# Patient Record
Sex: Male | Born: 1955
Health system: Southern US, Community
[De-identification: ages and names within clinical notes are randomized; demographics above are authoritative.]

## PROBLEM LIST (undated history)

## (undated) DIAGNOSIS — Z87898 Personal history of other specified conditions: Secondary | ICD-10-CM

## (undated) DIAGNOSIS — I700 Atherosclerosis of aorta: Secondary | ICD-10-CM

## (undated) DIAGNOSIS — E785 Hyperlipidemia, unspecified: Secondary | ICD-10-CM

## (undated) DIAGNOSIS — I10 Essential (primary) hypertension: Secondary | ICD-10-CM

## (undated) DIAGNOSIS — E559 Vitamin D deficiency, unspecified: Secondary | ICD-10-CM

## (undated) DIAGNOSIS — M1990 Unspecified osteoarthritis, unspecified site: Secondary | ICD-10-CM

## (undated) DIAGNOSIS — T7840XA Allergy, unspecified, initial encounter: Secondary | ICD-10-CM

## (undated) HISTORY — DX: Allergy, unspecified, initial encounter: T78.40XA

## (undated) HISTORY — DX: Unspecified osteoarthritis, unspecified site: M19.90

## (undated) HISTORY — PX: OTHER SURGICAL HISTORY: SHX169

## (undated) HISTORY — DX: Hyperlipidemia, unspecified: E78.5

## (undated) HISTORY — DX: Vitamin D deficiency, unspecified: E55.9

## (undated) HISTORY — DX: Essential (primary) hypertension: I10

## (undated) HISTORY — PX: COLONOSCOPY: SHX174

## (undated) HISTORY — PX: PILONIDAL CYST EXCISION: SHX744

## (undated) HISTORY — PX: KNEE SURGERY: SHX244

## (undated) HISTORY — DX: Atherosclerosis of aorta: I70.0

---
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
## 1974-12-17 HISTORY — PX: APPENDECTOMY: SHX54

## 2005-11-02 ENCOUNTER — Ambulatory Visit: Payer: Self-pay | Admitting: Internal Medicine

## 2005-11-26 ENCOUNTER — Ambulatory Visit: Payer: Self-pay | Admitting: Internal Medicine

## 2013-03-06 ENCOUNTER — Other Ambulatory Visit: Payer: Self-pay | Admitting: Family Medicine

## 2013-03-17 ENCOUNTER — Other Ambulatory Visit (INDEPENDENT_AMBULATORY_CARE_PROVIDER_SITE_OTHER): Payer: PRIVATE HEALTH INSURANCE

## 2013-03-17 DIAGNOSIS — Z09 Encounter for follow-up examination after completed treatment for conditions other than malignant neoplasm: Secondary | ICD-10-CM

## 2013-03-19 NOTE — Progress Notes (Signed)
Pt dropped off FOBT only 

## 2013-04-17 ENCOUNTER — Other Ambulatory Visit: Payer: Self-pay | Admitting: Family Medicine

## 2013-07-02 ENCOUNTER — Ambulatory Visit (INDEPENDENT_AMBULATORY_CARE_PROVIDER_SITE_OTHER): Payer: PRIVATE HEALTH INSURANCE | Admitting: Family Medicine

## 2013-07-02 ENCOUNTER — Encounter: Payer: Self-pay | Admitting: Family Medicine

## 2013-07-02 VITALS — BP 130/77 | HR 63 | Temp 97.0°F | Ht 72.0 in | Wt 209.4 lb

## 2013-07-02 DIAGNOSIS — J309 Allergic rhinitis, unspecified: Secondary | ICD-10-CM

## 2013-07-02 DIAGNOSIS — N529 Male erectile dysfunction, unspecified: Secondary | ICD-10-CM

## 2013-07-02 DIAGNOSIS — R5383 Other fatigue: Secondary | ICD-10-CM

## 2013-07-02 DIAGNOSIS — Z87898 Personal history of other specified conditions: Secondary | ICD-10-CM

## 2013-07-02 DIAGNOSIS — E785 Hyperlipidemia, unspecified: Secondary | ICD-10-CM

## 2013-07-02 DIAGNOSIS — I10 Essential (primary) hypertension: Secondary | ICD-10-CM

## 2013-07-02 DIAGNOSIS — E559 Vitamin D deficiency, unspecified: Secondary | ICD-10-CM

## 2013-07-02 HISTORY — DX: Personal history of other specified conditions: Z87.898

## 2013-07-02 LAB — BASIC METABOLIC PANEL WITH GFR
BUN: 16 mg/dL (ref 6–23)
CO2: 27 mEq/L (ref 19–32)
Chloride: 103 mEq/L (ref 96–112)
Creat: 1.08 mg/dL (ref 0.50–1.35)
Glucose, Bld: 101 mg/dL — ABNORMAL HIGH (ref 70–99)
Potassium: 4.7 mEq/L (ref 3.5–5.3)

## 2013-07-02 LAB — POCT CBC
Granulocyte percent: 71.7 %G (ref 37–80)
HCT, POC: 44.9 % (ref 43.5–53.7)
Lymph, poc: 1.4 (ref 0.6–3.4)
MCH, POC: 33.5 pg — AB (ref 27–31.2)
MCV: 94 fL (ref 80–97)
Platelet Count, POC: 206 10*3/uL (ref 142–424)
RBC: 4.8 M/uL (ref 4.69–6.13)
RDW, POC: 11.9 %
WBC: 6.1 10*3/uL (ref 4.6–10.2)

## 2013-07-02 LAB — HEPATIC FUNCTION PANEL
ALT: 33 U/L (ref 0–53)
AST: 26 U/L (ref 0–37)
Bilirubin, Direct: 0.1 mg/dL (ref 0.0–0.3)
Indirect Bilirubin: 0.5 mg/dL (ref 0.0–0.9)
Total Protein: 6.7 g/dL (ref 6.0–8.3)

## 2013-07-02 NOTE — Progress Notes (Signed)
  Subjective:    Patient ID: Norman Briggs, male    DOB: 1956/09/21, 57 y.o.   MRN: [id number]  HPI Patient returns to clinic today for followup and management of chronic medical problems. These include hypertension, hyperlipidemia, allergic rhinitis, and a history of an elevated PSA. PSA currently is followed by the urologist but we do the blood work here. He also has a history of erectile dysfunction. All of his health maintenance issues appear to be up-to-date. He does complain of a lot of belching, but no heartburn. He brings in his home blood pressure readings today, and these are good.   Review of Systems  Constitutional: Positive for fatigue (slight).  HENT: Positive for congestion (slight due to allergies) and postnasal drip. Negative for ear pain and sore throat.   Eyes: Negative.   Respiratory: Negative.   Cardiovascular: Negative.   Gastrointestinal: Negative.   Genitourinary: Negative.   Musculoskeletal: Positive for arthralgias (bilateral kneea, R shoulder).  Skin: Positive for color change (facial redness).  Allergic/Immunologic: Positive for environmental allergies (seasonal, worse in the Fall).  Neurological: Negative.   Hematological: Negative.   Psychiatric/Behavioral: Negative.        Objective:   Physical Exam BP 130/77  Pulse 63  Temp(Src) 97 F (36.1 C) (Oral)  Ht 6' (1.829 m)  Wt 209 lb 6.4 oz (94.983 kg)  BMI 28.39 kg/m2  The patient appeared well nourished and normally developed, alert and oriented to time and place. Speech, behavior and judgement appear normal. Vital signs as documented.  Head exam is unremarkable. No scleral icterus or pallor noted. There is some nasal congestion on the left. The ear canals, TMs, mouth and throat are all within normal limits.  Neck is without jugular venous distension, thyromegally, or carotid bruits. Carotid upstrokes are brisk bilaterally. No cervical adenopathy. Lungs are clear anteriorly and posteriorly to  auscultation. Normal respiratory effort. Cardiac exam reveals regular rate and rhythm at 60 per minute. First and second heart sounds normal.  No murmurs, rubs or gallops.  Abdominal exam reveals normal bowl sounds, no masses, no organomegaly and no aortic enlargement. No inguinal adenopathy. Abdomen was nontender. Extremities are nonedematous and both femoral and pedal pulses are normal. Skin without pallor or jaundice.  Warm and dry, without rash. There are some small varicosities around the ankles. Neurologic exam reveals normal deep tendon reflexes and normal sensation.          Assessment & Plan:  1. Hyperlipemia  2. Hypertension - Hepatic function panel; Standing - NMR Lipoprofile with Lipids; Standing - BASIC METABOLIC PANEL WITH GFR; Standing - Hepatic function panel - NMR Lipoprofile with Lipids - BASIC METABOLIC PANEL WITH GFR  3. Allergic rhinitis  4. Fatigue - POCT CBC; Standing - Thyroid Panel With TSH - POCT CBC  5. Vitamin D deficiency - Vitamin D 25 hydroxy; Standing - Vitamin D 25 hydroxy  6. History of elevated PSA - POCT urinalysis dipstick - POCT UA - Microscopic Only - PSA  7. ED (erectile dysfunction  Patient Instructions  Continue current medications and aggressive therapeutic lifestyle changes which include diet and exercise For the belching, try Zantac 150 twice daily before breakfast and supper over-the-counter for one month Also try to eat and chew your food more slowly, don't use chewing gum, and avoid dairy products as much as possible This summer drink plenty of fluids and keep well hydrated   Nyra Capes MD

## 2013-07-02 NOTE — Patient Instructions (Signed)
Continue current medications and aggressive therapeutic lifestyle changes which include diet and exercise For the belching, try Zantac 150 twice daily before breakfast and supper over-the-counter for one month Also try to eat and chew your food more slowly, don't use chewing gum, and avoid dairy products as much as possible This summer drink plenty of fluids and keep well hydrated

## 2013-07-03 LAB — NMR LIPOPROFILE WITH LIPIDS
Cholesterol, Total: 116 mg/dL (ref <200)
HDL Particle Number: 33.3 umol/L (ref >=30.5)
LDL Size: 19.8 nm — ABNORMAL LOW (ref >20.5)
Large VLDL-P: 3.6 nmol/L — ABNORMAL HIGH (ref <=2.7)
Small LDL Particle Number: 830 nmol/L — ABNORMAL HIGH (ref <=527)

## 2013-07-08 ENCOUNTER — Other Ambulatory Visit (INDEPENDENT_AMBULATORY_CARE_PROVIDER_SITE_OTHER): Payer: PRIVATE HEALTH INSURANCE

## 2013-07-08 DIAGNOSIS — Z1212 Encounter for screening for malignant neoplasm of rectum: Secondary | ICD-10-CM

## 2013-07-08 NOTE — Progress Notes (Signed)
Pt notified of results

## 2014-01-07 ENCOUNTER — Ambulatory Visit (INDEPENDENT_AMBULATORY_CARE_PROVIDER_SITE_OTHER): Payer: PRIVATE HEALTH INSURANCE

## 2014-01-07 ENCOUNTER — Ambulatory Visit (INDEPENDENT_AMBULATORY_CARE_PROVIDER_SITE_OTHER): Payer: PRIVATE HEALTH INSURANCE | Admitting: Family Medicine

## 2014-01-07 ENCOUNTER — Encounter: Payer: Self-pay | Admitting: Family Medicine

## 2014-01-07 VITALS — BP 126/82 | HR 66 | Temp 96.5°F | Ht 72.0 in | Wt 208.0 lb

## 2014-01-07 DIAGNOSIS — R059 Cough, unspecified: Secondary | ICD-10-CM

## 2014-01-07 DIAGNOSIS — R05 Cough: Secondary | ICD-10-CM

## 2014-01-07 DIAGNOSIS — Z23 Encounter for immunization: Secondary | ICD-10-CM

## 2014-01-07 DIAGNOSIS — I10 Essential (primary) hypertension: Secondary | ICD-10-CM

## 2014-01-07 DIAGNOSIS — E785 Hyperlipidemia, unspecified: Secondary | ICD-10-CM

## 2014-01-07 DIAGNOSIS — E559 Vitamin D deficiency, unspecified: Secondary | ICD-10-CM

## 2014-01-07 DIAGNOSIS — Z87898 Personal history of other specified conditions: Secondary | ICD-10-CM

## 2014-01-07 DIAGNOSIS — E8881 Metabolic syndrome: Secondary | ICD-10-CM

## 2014-01-07 LAB — POCT CBC
Granulocyte percent: 72.8 %G (ref 37–80)
HCT, POC: 48.7 % (ref 43.5–53.7)
HEMOGLOBIN: 16.1 g/dL (ref 14.1–18.1)
LYMPH, POC: 1.3 (ref 0.6–3.4)
MCH: 31.1 pg (ref 27–31.2)
MCHC: 33 g/dL (ref 31.8–35.4)
MCV: 94.2 fL (ref 80–97)
MPV: 8.7 fL (ref 0–99.8)
POC Granulocyte: 5 (ref 2–6.9)
POC LYMPH PERCENT: 19.5 %L (ref 10–50)
Platelet Count, POC: 243 10*3/uL (ref 142–424)
RBC: 5.2 M/uL (ref 4.69–6.13)
RDW, POC: 12.3 %
WBC: 6.9 10*3/uL (ref 4.6–10.2)

## 2014-01-07 LAB — POCT GLYCOSYLATED HEMOGLOBIN (HGB A1C): Hemoglobin A1C: 5.3

## 2014-01-07 MED ORDER — AMLODIPINE BESYLATE-VALSARTAN 5-320 MG PO TABS: ORAL_TABLET | ORAL | Status: DC

## 2014-01-07 MED ORDER — HYDROCHLOROTHIAZIDE 25 MG PO TABS: 25.0000 mg | ORAL_TABLET | Freq: Every day | ORAL | Status: DC

## 2014-01-07 MED ORDER — ATORVASTATIN CALCIUM 20 MG PO TABS: 20.0000 mg | ORAL_TABLET | Freq: Every day | ORAL | Status: DC

## 2014-01-07 MED ORDER — AZELASTINE HCL 0.15 % NA SOLN: NASAL | Status: DC

## 2014-01-07 NOTE — Patient Instructions (Addendum)
Continue current medications. Continue good therapeutic lifestyle changes which include good diet and exercise. Fall precautions discussed with patient. Schedule your flu vaccine if you haven't had it yet If you are over 59 years old - you may need Prevnar 58 or the adult Pneumonia vaccine. Check with her insurance regarding the Prevnar vaccine Use a cool mist humidifier more regularly. Use saline nose spray as directed Drink more fluids You can purchase Mucinex over-the-counter Maximum strength, blue and white in color one twice daily for cough and congestion

## 2014-01-07 NOTE — Progress Notes (Signed)
Subjective:    Patient ID: Norman Briggs, male    DOB: 02-Jan-1956, 58 y.o.   MRN: [id number]  HPI Pt here for follow up and management of chronic medical problems. Patient comes in today. He is doing well. He does describe a cough clearing the throat problem in certain situations. Hi is been oing on for 2-3 months. He indicates that his blood pressure medicine was switched to a generic formulation about the same time. He continues to use his nose sprays. He does use a cool mist humidifier periodically. He has a remote history of cigarette smoking.        Patient Active Problem List   Diagnosis Date Noted  . Hyperlipemia 07/02/2013  . Hypertension 07/02/2013  . Allergic rhinitis 07/02/2013  . History of elevated PSA 07/02/2013  . ED (erectile dysfunction) 07/02/2013  . Vitamin D deficiency 07/02/2013   Outpatient Encounter Prescriptions as of 01/07/2014  Medication Sig  . aspirin EC 81 MG tablet Take 81 mg by mouth daily.  . ASTEPRO 0.15 % SOLN USE 1 SPRAYS IN EACH NOSTRIL AR BEDTIME  . atorvastatin (LIPITOR) 20 MG tablet Take 20 mg by mouth daily at 6 PM. As directed  . Cholecalciferol (VITAMIN D-3) 5000 UNITS TABS Take 1 tablet by mouth daily.  Marland Kitchen EXFORGE 5-320 MG per tablet TAKE 1 TABLET BY MOUTH EVERY DAY  . hydrochlorothiazide (HYDRODIURIL) 25 MG tablet Take 25 mg by mouth daily. As directed  . Multiple Vitamins-Minerals (EYE-VITE PLUS LUTEIN) CAPS Take 1 capsule by mouth daily.   . Omega-3 Fatty Acids (FISH OIL) 1000 MG CAPS Take 2 capsules by mouth daily.  Marland Kitchen triamcinolone (NASACORT) 55 MCG/ACT nasal inhaler USE 1 SPRAY IN EACH NOSTRIL DAILY  . tadalafil (CIALIS) 5 MG tablet Take 5 mg by mouth daily as needed for erectile dysfunction.    Review of Systems  Constitutional: Negative.   HENT: Negative.   Eyes: Negative.   Respiratory: Negative.   Cardiovascular: Negative.   Gastrointestinal: Negative.   Endocrine: Negative.   Genitourinary: Negative.   Musculoskeletal:  Negative.   Skin: Negative.   Allergic/Immunologic: Negative.   Neurological: Negative.   Hematological: Negative.   Psychiatric/Behavioral: Negative.        Objective:   Physical Exam  Nursing note and vitals reviewed. Constitutional: He is oriented to person, place, and time. He appears well-developed and well-nourished. No distress.  HENT:  Head: Normocephalic and atraumatic.  Right Ear: External ear normal.  Left Ear: External ear normal.  Mouth/Throat: Oropharynx is clear and moist. No oropharyngeal exudate.  There is nasal congestion bilaterally  Eyes: Conjunctivae and EOM are normal. Pupils are equal, round, and reactive to light. Right eye exhibits no discharge. Left eye exhibits no discharge. No scleral icterus.  Neck: Normal range of motion. Neck supple. No thyromegaly present.  No carotid bruit  Cardiovascular: Normal rate, regular rhythm, normal heart sounds and intact distal pulses.  Exam reveals no gallop and no friction rub.   No murmur heard. At 60 per minute  Pulmonary/Chest: Effort normal and breath sounds normal. No respiratory distress. He has no wheezes. He has no rales. He exhibits no tenderness.  No axillary nodes  Abdominal: Soft. Bowel sounds are normal. He exhibits no mass. There is no tenderness. There is no rebound and no guarding.  Musculoskeletal: Normal range of motion. He exhibits no edema and no tenderness.  Lymphadenopathy:    He has no cervical adenopathy.  Neurological: He is alert and oriented to person, place,  and time. He has normal reflexes. No cranial nerve deficit.  Skin: Skin is warm and dry. No rash noted. No erythema. No pallor.  Psychiatric: He has a normal mood and affect. His behavior is normal. Judgment and thought content normal.   BP 126/82  Pulse 66  Temp(Src) 96.5 F (35.8 C) (Oral)  Ht 6' (1.829 m)  Wt 208 lb (94.348 kg)  BMI 28.20 kg/m2  [facility] reading (PRIMARY) by  Dr. Brunilda Payor x-ray; --no active disease                                        Assessment & Plan:     1. Vitamin D deficiency - POCT CBC - Vit D  25 hydroxy (rtn osteoporosis monitoring)  2. Hypertension - POCT CBC - BMP8+EGFR - Hepatic function panel - DG Chest 2 View; Future  3. Hyperlipemia - POCT CBC - NMR, lipoprofile  4. History of elevated PSA - POCT CBC  5. Metabolic syndrome - POCT glycosylated hemoglobin (Hb A1C)  6. Cough   Meds ordered this encounter  Medications  . Omega-3 Fatty Acids (FISH OIL) 1000 MG CAPS    Sig: Take 2 capsules by mouth daily.  . hydrochlorothiazide (HYDRODIURIL) 25 MG tablet    Sig: Take 1 tablet (25 mg total) by mouth daily. As directed    Dispense:  30 tablet    Refill:  6  . amLODipine-valsartan (EXFORGE) 5-320 MG per tablet    Sig: TAKE 1 TABLET BY MOUTH EVERY DAY    Dispense:  30 tablet    Refill:  6  . atorvastatin (LIPITOR) 20 MG tablet    Sig: Take 1 tablet (20 mg total) by mouth daily at 6 PM. As directed    Dispense:  30 tablet    Refill:  6  . Azelastine HCl (ASTEPRO) 0.15 % SOLN    Sig: USE 1 SPRAYS IN EACH NOSTRIL AR BEDTIME    Dispense:  16 mL    Refill:  6   Patient Instructions  Continue current medications. Continue good therapeutic lifestyle changes which include good diet and exercise. Fall precautions discussed with patient. Schedule your flu vaccine if you haven't had it yet If you are over 46 years old - you may need Prevnar 73 or the adult Pneumonia vaccine. Check with her insurance regarding the Prevnar vaccine Use a cool mist humidifier more regularly. Use saline nose spray as directed Drink more fluids You can purchase Mucinex over-the-counter Maximum strength, blue and white in color one twice daily for cough and congestion   Arrie Senate MD

## 2014-01-09 LAB — VITAMIN D 25 HYDROXY (VIT D DEFICIENCY, FRACTURES): VIT D 25 HYDROXY: 59.3 ng/mL (ref 30.0–100.0)

## 2014-01-09 LAB — BMP8+EGFR
BUN / CREAT RATIO: 13 (ref 9–20)
BUN: 13 mg/dL (ref 6–24)
CO2: 26 mmol/L (ref 18–29)
CREATININE: 1.04 mg/dL (ref 0.76–1.27)
Calcium: 9.8 mg/dL (ref 8.7–10.2)
Chloride: 98 mmol/L (ref 97–108)
GFR calc Af Amer: 91 mL/min/{1.73_m2} (ref >59)
GFR, EST NON AFRICAN AMERICAN: 79 mL/min/{1.73_m2} (ref >59)
Glucose: 112 mg/dL — ABNORMAL HIGH (ref 65–99)
Potassium: 4.7 mmol/L (ref 3.5–5.2)
SODIUM: 140 mmol/L (ref 134–144)

## 2014-01-09 LAB — HEPATIC FUNCTION PANEL
ALK PHOS: 72 IU/L (ref 39–117)
ALT: 25 IU/L (ref 0–44)
AST: 23 IU/L (ref 0–40)
Albumin: 4.6 g/dL (ref 3.5–5.5)
BILIRUBIN DIRECT: 0.18 mg/dL (ref 0.00–0.40)
BILIRUBIN TOTAL: 0.6 mg/dL (ref 0.0–1.2)
Total Protein: 6.7 g/dL (ref 6.0–8.5)

## 2014-01-09 LAB — NMR, LIPOPROFILE
Cholesterol: 127 mg/dL (ref <200)
HDL Cholesterol by NMR: 47 mg/dL (ref >=40)
HDL Particle Number: 31.7 umol/L (ref >=30.5)
LDL Particle Number: 834 nmol/L (ref <1000)
LDL SIZE: 20.5 nm — AB (ref >20.5)
LDLC SERPL CALC-MCNC: 61 mg/dL (ref <100)
LP-IR Score: 46 — ABNORMAL HIGH (ref <=45)
SMALL LDL PARTICLE NUMBER: 386 nmol/L (ref <=527)
Triglycerides by NMR: 97 mg/dL (ref <150)

## 2014-01-13 ENCOUNTER — Telehealth: Payer: Self-pay | Admitting: Family Medicine

## 2014-01-13 NOTE — Telephone Encounter (Signed)
done

## 2014-01-15 ENCOUNTER — Encounter: Payer: Self-pay | Admitting: *Deleted

## 2014-07-08 ENCOUNTER — Encounter: Payer: Self-pay | Admitting: Family Medicine

## 2014-07-08 ENCOUNTER — Ambulatory Visit (INDEPENDENT_AMBULATORY_CARE_PROVIDER_SITE_OTHER): Payer: PRIVATE HEALTH INSURANCE | Admitting: Family Medicine

## 2014-07-08 VITALS — BP 120/72 | HR 67 | Temp 98.2°F | Ht 72.0 in | Wt 193.0 lb

## 2014-07-08 DIAGNOSIS — E8881 Metabolic syndrome: Secondary | ICD-10-CM

## 2014-07-08 DIAGNOSIS — N528 Other male erectile dysfunction: Secondary | ICD-10-CM

## 2014-07-08 DIAGNOSIS — Z87898 Personal history of other specified conditions: Secondary | ICD-10-CM

## 2014-07-08 DIAGNOSIS — E785 Hyperlipidemia, unspecified: Secondary | ICD-10-CM

## 2014-07-08 DIAGNOSIS — E559 Vitamin D deficiency, unspecified: Secondary | ICD-10-CM

## 2014-07-08 DIAGNOSIS — L989 Disorder of the skin and subcutaneous tissue, unspecified: Secondary | ICD-10-CM

## 2014-07-08 DIAGNOSIS — I10 Essential (primary) hypertension: Secondary | ICD-10-CM

## 2014-07-08 DIAGNOSIS — Z0000 Encounter for general adult medical examination without abnormal findings: Secondary | ICD-10-CM | POA: Diagnosis not present

## 2014-07-08 LAB — POCT CBC
GRANULOCYTE PERCENT: 77 % (ref 37–80)
HCT, POC: 49.3 % (ref 43.5–53.7)
HEMOGLOBIN: 15.9 g/dL (ref 14.1–18.1)
Lymph, poc: 1.2 (ref 0.6–3.4)
MCH, POC: 30.2 pg (ref 27–31.2)
MCHC: 32.3 g/dL (ref 31.8–35.4)
MCV: 93.5 fL (ref 80–97)
MPV: 8.3 fL (ref 0–99.8)
POC GRANULOCYTE: 5.2 (ref 2–6.9)
POC LYMPH %: 17.8 % (ref 10–50)
Platelet Count, POC: 239 10*3/uL (ref 142–424)
RBC: 5.3 M/uL (ref 4.69–6.13)
RDW, POC: 12.6 %
WBC: 6.7 10*3/uL (ref 4.6–10.2)

## 2014-07-08 LAB — POCT UA - MICROSCOPIC ONLY
Bacteria, U Microscopic: NEGATIVE
CRYSTALS, UR, HPF, POC: NEGATIVE
Casts, Ur, LPF, POC: NEGATIVE
Mucus, UA: NEGATIVE
RBC, urine, microscopic: NEGATIVE
WBC, Ur, HPF, POC: NEGATIVE
Yeast, UA: NEGATIVE

## 2014-07-08 LAB — POCT URINALYSIS DIPSTICK
Bilirubin, UA: NEGATIVE
Blood, UA: NEGATIVE
Glucose, UA: NEGATIVE
Ketones, UA: NEGATIVE
Leukocytes, UA: NEGATIVE
Nitrite, UA: NEGATIVE
PH UA: 7.5
PROTEIN UA: NEGATIVE
SPEC GRAV UA: 1.01
Urobilinogen, UA: NEGATIVE

## 2014-07-08 LAB — POCT GLYCOSYLATED HEMOGLOBIN (HGB A1C)

## 2014-07-08 NOTE — Progress Notes (Signed)
Subjective:    Patient ID: Norman Briggs, male    DOB: 12/27/1955, 58 y.o.   MRN: [id number]  HPI Patient is here today for annual wellness exam and follow up of chronic medical problems. He does complain of some right knee pain and he is concerned about some skin lesions on his nose and ear. He also sees the urologist and [facility] once a year. He has an appointment scheduled for today. He will get lab work today and an FOBT to return. He is also due to receive a Prevnar he will have to check with his insurance regarding this. The patient is also concerned that his erections are not as good as they used to be. He has tried Cialis 5 and this is not working well for him. He is seeing the urologist next week.        Patient Active Problem List   Diagnosis Date Noted  . Hyperlipemia 07/02/2013  . Hypertension 07/02/2013  . Allergic rhinitis 07/02/2013  . History of elevated PSA 07/02/2013  . ED (erectile dysfunction) 07/02/2013  . Vitamin D deficiency 07/02/2013   Outpatient Encounter Prescriptions as of 07/08/2014  Medication Sig  . amLODipine-valsartan (EXFORGE) 5-320 MG per tablet TAKE 1 TABLET BY MOUTH EVERY DAY  . aspirin EC 81 MG tablet Take 81 mg by mouth daily.  . Azelastine HCl (ASTEPRO) 0.15 % SOLN USE 1 SPRAYS IN EACH NOSTRIL AR BEDTIME  . Cholecalciferol (VITAMIN D-3) 5000 UNITS TABS Take 1 tablet by mouth daily.  . hydrochlorothiazide (HYDRODIURIL) 25 MG tablet Take 1 tablet (25 mg total) by mouth daily. As directed  . Multiple Vitamins-Minerals (EYE-VITE PLUS LUTEIN) CAPS Take 1 capsule by mouth daily.   . Omega-3 Fatty Acids (FISH OIL) 1000 MG CAPS Take 2 capsules by mouth daily.  Marland Kitchen triamcinolone (NASACORT) 55 MCG/ACT nasal inhaler USE 1 SPRAY IN EACH NOSTRIL DAILY  . tadalafil (CIALIS) 5 MG tablet Take 5 mg by mouth daily as needed for erectile dysfunction.  . [DISCONTINUED] atorvastatin (LIPITOR) 20 MG tablet Take 1 tablet (20 mg total) by mouth daily at 6 PM. As  directed    Review of Systems  Constitutional: Negative.   HENT: Negative.   Eyes: Negative.   Respiratory: Negative.   Cardiovascular: Negative.   Gastrointestinal: Negative.   Endocrine: Negative.   Genitourinary: Negative.   Musculoskeletal: Positive for arthralgias (right knee).  Skin: Negative.        Check skin lesions - nose and ear  Allergic/Immunologic: Negative.   Neurological: Negative.   Hematological: Negative.   Psychiatric/Behavioral: Negative.        Objective:   Physical Exam  Nursing note and vitals reviewed. Constitutional: He is oriented to person, place, and time. He appears well-developed and well-nourished. No distress.  HENT:  Head: Normocephalic and atraumatic.  Right Ear: External ear normal.  Left Ear: External ear normal.  Mouth/Throat: Oropharynx is clear and moist. No oropharyngeal exudate.  Nasal congestion bilaterally  Eyes: Conjunctivae and EOM are normal. Pupils are equal, round, and reactive to light. Right eye exhibits no discharge. Left eye exhibits no discharge. No scleral icterus.  Neck: Normal range of motion. Neck supple. No thyromegaly present.  Cardiovascular: Normal rate, regular rhythm, normal heart sounds and intact distal pulses.  Exam reveals no gallop and no friction rub.   No murmur heard. At 72 per minute  Pulmonary/Chest: Effort normal and breath sounds normal. No respiratory distress. He has no wheezes. He has no rales. He exhibits  no tenderness.  Abdominal: Soft. Bowel sounds are normal. He exhibits no mass. There is no tenderness. There is no rebound and no guarding.  Genitourinary:  This exam will be done by the urologist next week  Musculoskeletal: Normal range of motion. He exhibits no edema and no tenderness.  Lymphadenopathy:    He has no cervical adenopathy.  Neurological: He is alert and oriented to person, place, and time. He has normal reflexes. No cranial nerve deficit.  Skin: Skin is warm and dry. No rash  noted. There is erythema. No pallor.  Dry skin lesion right ear pinna. Nasal erythema and small eschar nasal tip  Psychiatric: He has a normal mood and affect. His behavior is normal. Judgment and thought content normal.   BP 120/72  Pulse 67  Temp(Src) 98.2 F (36.8 C) (Oral)  Ht 6' (1.829 m)  Wt 193 lb (87.544 kg)  BMI 26.17 kg/m2  DGL:[id number] from previous tracings       Assessment & Plan:  1. Hyperlipemia - POCT CBC - NMR, lipoprofile - EKG 12-Lead  2. Essential hypertension - POCT CBC - BMP8+EGFR - Hepatic function panel - EKG 12-Lead  3. Vitamin D deficiency - POCT CBC - Vit D  25 hydroxy (rtn osteoporosis monitoring)  4. History of elevated PSA - POCT CBC - POCT UA - Microscopic Only - POCT urinalysis dipstick - PSA, total and free  5. Metabolic syndrome - POCT CBC - POCT glycosylated hemoglobin (Hb A1C)  6. Annual physical exam - POCT CBC - POCT glycosylated hemoglobin (Hb A1C) - BMP8+EGFR - Hepatic function panel - NMR, lipoprofile - Vit D  25 hydroxy (rtn osteoporosis monitoring) - POCT UA - Microscopic Only - POCT urinalysis dipstick - PSA, total and free - EKG 12-Lead  7. Skin lesions - Ambulatory referral to Dermatology  8. Other male erectile dysfunction -A sample of Cialis 20 was given to the patient to try  No orders of the defined types were placed in this encounter.    Patient Instructions  Continue current medications. Continue good therapeutic lifestyle changes which include good diet and exercise. Fall precautions discussed with patient. If an FOBT was given today- please return it to our front desk. If you are over 46 years old - you may need Prevnar 80 or the adult Pneumonia vaccine.  Please check with your insurance regarding the Prevnar vaccine Please arrange an appointment with the orthopedist that has been following you about your knee pain We will arrange an appointment with your dermatologist for the nasal  irritation and the right ear skin lesion Stay active, followup aggressive therapeutic lifestyle changes which include diet and exercise We have given you samples of Cialis 20 for you to try. Let the urologist know if this is effective or not   Return the FOBT Arrie Senate MD

## 2014-07-08 NOTE — Patient Instructions (Addendum)
Continue current medications. Continue good therapeutic lifestyle changes which include good diet and exercise. Fall precautions discussed with patient. If an FOBT was given today- please return it to our front desk. If you are over 59 years old - you may need Prevnar 83 or the adult Pneumonia vaccine.  Please check with your insurance regarding the Prevnar vaccine Please arrange an appointment with the orthopedist that has been following you about your knee pain We will arrange an appointment with your dermatologist for the nasal irritation and the right ear skin lesion Stay active, followup aggressive therapeutic lifestyle changes which include diet and exercise We have given you samples of Cialis 20 for you to try. Let the urologist know if this is effective or not Return the FOBT

## 2014-07-09 ENCOUNTER — Telehealth: Payer: Self-pay | Admitting: Family Medicine

## 2014-07-09 LAB — BMP8+EGFR
BUN/Creatinine Ratio: 14 (ref 9–20)
BUN: 17 mg/dL (ref 6–24)
CALCIUM: 9.6 mg/dL (ref 8.7–10.2)
CO2: 24 mmol/L (ref 18–29)
Chloride: 99 mmol/L (ref 97–108)
Creatinine, Ser: 1.2 mg/dL (ref 0.76–1.27)
GFR calc Af Amer: 77 mL/min/{1.73_m2} (ref >59)
GFR calc non Af Amer: 66 mL/min/{1.73_m2} (ref >59)
GLUCOSE: 103 mg/dL — AB (ref 65–99)
POTASSIUM: 4.3 mmol/L (ref 3.5–5.2)
Sodium: 140 mmol/L (ref 134–144)

## 2014-07-09 LAB — NMR, LIPOPROFILE
CHOLESTEROL: 166 mg/dL (ref 100–199)
HDL Cholesterol by NMR: 54 mg/dL (ref >39)
HDL Particle Number: 31.5 umol/L (ref >=30.5)
LDL PARTICLE NUMBER: 1319 nmol/L — AB (ref <1000)
LDL SIZE: 20.9 nm (ref >20.5)
LDLC SERPL CALC-MCNC: 98 mg/dL (ref 0–99)
LP-IR Score: 32 (ref <=45)
Small LDL Particle Number: 420 nmol/L (ref <=527)
TRIGLYCERIDES BY NMR: 68 mg/dL (ref 0–149)

## 2014-07-09 LAB — PSA, TOTAL AND FREE
PSA, Free Pct: 24.4 %
PSA, Free: 0.39 ng/mL
PSA: 1.6 ng/mL (ref 0.0–4.0)

## 2014-07-09 LAB — HEPATIC FUNCTION PANEL
ALT: 19 IU/L (ref 0–44)
AST: 24 IU/L (ref 0–40)
Albumin: 4.5 g/dL (ref 3.5–5.5)
Alkaline Phosphatase: 70 IU/L (ref 39–117)
BILIRUBIN DIRECT: 0.18 mg/dL (ref 0.00–0.40)
TOTAL PROTEIN: 6.9 g/dL (ref 6.0–8.5)
Total Bilirubin: 0.8 mg/dL (ref 0.0–1.2)

## 2014-07-09 LAB — VITAMIN D 25 HYDROXY (VIT D DEFICIENCY, FRACTURES): Vit D, 25-Hydroxy: 65.2 ng/mL (ref 30.0–100.0)

## 2014-07-09 NOTE — Telephone Encounter (Signed)
Message copied by Waverly Ferrari on Fri Jul 09, 2014 10:12 AM ------      Message from: Chipper Herb      Created: Fri Jul 09, 2014  7:15 AM       Blood sugar slightly elevated at 103. The creatinine, the most important kidney function test is within normal limits. The electrolytes including potassium are within normal limits.      All liver function tests are within normal limits.      Advanced lipid testing, a total LDL particle number is elevated at 1319. Previously it was 834. The goal is to have this number less than 1000. The LDL C. remains at goal at 98 but more than it was 6 months ago when it was 61. The triglycerides are good at 68.----For now, no statin treatment just continue with as aggressive therapeutic lifestyle changes as possible which include diet exercise and weight loss. We will readdress the need for any further statin treatment at the next blood draw.      The vitamin D level is good at 65.2, continue current treatment      The PSA remains low and within normal limits. It is now 1.6. ------

## 2014-07-19 ENCOUNTER — Other Ambulatory Visit: Payer: PRIVATE HEALTH INSURANCE

## 2014-07-19 DIAGNOSIS — Z1212 Encounter for screening for malignant neoplasm of rectum: Secondary | ICD-10-CM

## 2014-07-20 LAB — FECAL OCCULT BLOOD, IMMUNOCHEMICAL: Fecal Occult Bld: NEGATIVE

## 2014-07-26 ENCOUNTER — Encounter: Payer: Self-pay | Admitting: *Deleted

## 2015-01-11 ENCOUNTER — Encounter: Payer: Self-pay | Admitting: Family Medicine

## 2015-01-11 ENCOUNTER — Ambulatory Visit (INDEPENDENT_AMBULATORY_CARE_PROVIDER_SITE_OTHER): Payer: PRIVATE HEALTH INSURANCE

## 2015-01-11 ENCOUNTER — Ambulatory Visit (INDEPENDENT_AMBULATORY_CARE_PROVIDER_SITE_OTHER): Payer: PRIVATE HEALTH INSURANCE | Admitting: Family Medicine

## 2015-01-11 VITALS — BP 128/90 | HR 70 | Temp 97.1°F | Ht 72.0 in | Wt 202.0 lb

## 2015-01-11 DIAGNOSIS — R635 Abnormal weight gain: Secondary | ICD-10-CM

## 2015-01-11 DIAGNOSIS — M25511 Pain in right shoulder: Secondary | ICD-10-CM

## 2015-01-11 DIAGNOSIS — I10 Essential (primary) hypertension: Secondary | ICD-10-CM | POA: Diagnosis not present

## 2015-01-11 DIAGNOSIS — E785 Hyperlipidemia, unspecified: Secondary | ICD-10-CM | POA: Diagnosis not present

## 2015-01-11 DIAGNOSIS — E559 Vitamin D deficiency, unspecified: Secondary | ICD-10-CM | POA: Diagnosis not present

## 2015-01-11 DIAGNOSIS — N528 Other male erectile dysfunction: Secondary | ICD-10-CM | POA: Diagnosis not present

## 2015-01-11 DIAGNOSIS — Z87898 Personal history of other specified conditions: Secondary | ICD-10-CM

## 2015-01-11 LAB — POCT CBC
Granulocyte percent: 79.1 %G (ref 37–80)
HCT, POC: 50.5 % (ref 43.5–53.7)
Hemoglobin: 16.2 g/dL (ref 14.1–18.1)
Lymph, poc: 1.4 (ref 0.6–3.4)
MCH: 30.2 pg (ref 27–31.2)
MCHC: 32 g/dL (ref 31.8–35.4)
MCV: 94.3 fL (ref 80–97)
MPV: 7.9 fL (ref 0–99.8)
POC Granulocyte: 6.4 (ref 2–6.9)
POC LYMPH %: 16.7 % (ref 10–50)
Platelet Count, POC: 252 10*3/uL (ref 142–424)
RBC: 5.4 M/uL (ref 4.69–6.13)
RDW, POC: 11.8 %
WBC: 8.1 10*3/uL (ref 4.6–10.2)

## 2015-01-11 MED ORDER — MELOXICAM 15 MG PO TABS: 15.0000 mg | ORAL_TABLET | Freq: Every day | ORAL | Status: DC

## 2015-01-11 MED ORDER — SILDENAFIL CITRATE 20 MG PO TABS: ORAL_TABLET | ORAL | Status: DC

## 2015-01-11 NOTE — Progress Notes (Signed)
Subjective:    Patient ID: Norman Briggs, male    DOB: [birth year], 59 y.o.   MRN: [id number]  HPI Pt here for follow up and management of chronic medical problems which include hypertension and hyperlipidemia. He is taking all medications regularly. The patient does complain of ongoing problems with his right knee. He has seen an orthopedist about this. He also complains of right shoulder pain. The patient cannot describe any specific injury to the shoulder. It is important to note that the patient has gained 9 pounds of weight since his last visit in July. The patient does continue to have trouble with his knee and he is been told by the orthopedic surgeon that he needs a knee replacement. He is actually and a lot of pain with his right shoulder and this pain he thinks is coming from old injuries from throwing the baseball etc. his no trouble swinging the golf club. He does continue to work at a [employer], [employer] and has to lift tires every day.        Patient Active Problem List   Diagnosis Date Noted  . Metabolic syndrome 76/81/1572  . Hyperlipemia 07/02/2013  . Hypertension 07/02/2013  . Allergic rhinitis 07/02/2013  . History of elevated PSA 07/02/2013  . ED (erectile dysfunction) 07/02/2013  . Vitamin D deficiency 07/02/2013   Outpatient Encounter Prescriptions as of 01/11/2015  Medication Sig  . amLODipine-valsartan (EXFORGE) 5-320 MG per tablet TAKE 1 TABLET BY MOUTH EVERY DAY  . aspirin EC 81 MG tablet Take 81 mg by mouth daily.  . Cholecalciferol (VITAMIN D-3) 5000 UNITS TABS Take 1 tablet by mouth daily.  . hydrochlorothiazide (HYDRODIURIL) 25 MG tablet Take 1 tablet (25 mg total) by mouth daily. As directed  . Multiple Vitamins-Minerals (EYE-VITE PLUS LUTEIN) CAPS Take 1 capsule by mouth daily.   . Omega-3 Fatty Acids (FISH OIL) 1000 MG CAPS Take 2 capsules by mouth daily.  . [DISCONTINUED] tadalafil (CIALIS) 5 MG tablet Take 5 mg by mouth daily as needed for erectile  dysfunction.  . Azelastine HCl (ASTEPRO) 0.15 % SOLN USE 1 SPRAYS IN EACH NOSTRIL AR BEDTIME (Patient not taking: Reported on 01/11/2015)  . triamcinolone (NASACORT) 55 MCG/ACT nasal inhaler USE 1 SPRAY IN EACH NOSTRIL DAILY (Patient not taking: Reported on 01/11/2015)    Review of Systems  Constitutional: Negative.   HENT: Negative.   Eyes: Negative.   Respiratory: Negative.   Cardiovascular: Negative.   Gastrointestinal: Negative.   Endocrine: Negative.   Genitourinary: Negative.   Musculoskeletal: Positive for arthralgias (right knee - seen ortho         also       right shoulder pain ).  Skin: Negative.   Allergic/Immunologic: Negative.   Neurological: Negative.   Hematological: Negative.   Psychiatric/Behavioral: Negative.        Objective:   Physical Exam  Constitutional: He is oriented to person, place, and time. He appears well-developed and well-nourished. No distress.  HENT:  Head: Normocephalic and atraumatic.  Right Ear: External ear normal.  Left Ear: External ear normal.  Nose: Nose normal.  Mouth/Throat: Oropharynx is clear and moist. No oropharyngeal exudate.  Eyes: Conjunctivae and EOM are normal. Pupils are equal, round, and reactive to light. Right eye exhibits no discharge. Left eye exhibits no discharge. No scleral icterus.  Neck: Normal range of motion. Neck supple. No thyromegaly present.  No carotid bruits or anterior cervical adenopathy  Cardiovascular: Normal rate, regular rhythm, normal heart sounds and intact distal  pulses.   No murmur heard. The heart has a regular rate and rhythm at 72/m. The patient has good pulses.  Pulmonary/Chest: Effort normal and breath sounds normal. No respiratory distress. He has no wheezes. He has no rales. He exhibits no tenderness.  Axillary is negative for adenopathy and lungs are clear anteriorly and posteriorly  Abdominal: Soft. Bowel sounds are normal. He exhibits no mass. There is no tenderness. There is no rebound  and no guarding.  The abdomen is tender without masses bruits or inguinal adenopathy.  Genitourinary: Rectum normal.  Musculoskeletal: Normal range of motion. He exhibits no edema or tenderness.  There is generalized tenderness around the anterior shoulder there is no before meals joint tenderness there is no suprascapular tenderness.  Lymphadenopathy:    He has no cervical adenopathy.  Neurological: He is alert and oriented to person, place, and time. He has normal reflexes. No cranial nerve deficit.  Skin: Skin is warm and dry. No rash noted. No erythema. No pallor.  Psychiatric: He has a normal mood and affect. His behavior is normal. Judgment and thought content normal.  Nursing note and vitals reviewed.  BP 128/90 mmHg  Pulse 70  Temp(Src) 97.1 F (36.2 C) (Oral)  Ht 6' (1.829 m)  Wt 202 lb (91.627 kg)  BMI 27.39 kg/m2  [facility] reading (PRIMARY) by  Dr. Governor Rooks shoulder and C-spine-- degenerative changes                                       Assessment & Plan:  1. Hyperlipemia -Continue with aggressive therapeutic lifestyle changes and omega-3 fatty acids pending results of lab work being done today. - POCT CBC - NMR, lipoprofile  2. Essential hypertension -Continue sodium restriction, weight loss and monitor blood pressures closely -Continue with current medication - POCT CBC - BMP8+EGFR - Hepatic function panel  3. Vitamin D deficiency -Continue vitamin D 5000 units daily - POCT CBC - Vit D  25 hydroxy (rtn osteoporosis monitoring)  4. History of elevated PSA -Continue to be followed up by urologist - POCT CBC  5. Right shoulder pain -Take meloxicam one daily after eating as directed - DG Shoulder Right; Future - DG Cervical Spine Complete; Future  6. Other male erectile dysfunction -Try generic Viagra through [facility]  7. Weight gain -More aggressive management with exercise and diet  Meds ordered this encounter  Medications  . meloxicam  (MOBIC) 15 MG tablet    Sig: Take 1 tablet (15 mg total) by mouth daily. After a meal    Dispense:  30 tablet    Refill:  1  . sildenafil (REVATIO) 20 MG tablet    Sig: Take 2-5 tabs as needed for sexual activity.    Dispense:  50 tablet    Refill:  0   Arrie Senate MD

## 2015-01-11 NOTE — Patient Instructions (Addendum)
Continue current medications. Continue good therapeutic lifestyle changes which include good diet and exercise. Fall precautions discussed with patient. If an FOBT was given today- please return it to our front desk. If you are over 60 years old - you may need Prevnar 63 or the adult Pneumonia vaccine.  Flu Shots will be available at our office starting mid- September. Please call and schedule a FLU CLINIC APPOINTMENT.   Take anti-inflammatory medicine as directed--- please be aware of any potential allergic reaction with this medication. Make sure you take it after eating and continue to monitor your blood pressure regularly. Be sure and check with your insurance regarding the Prevnar vaccine and the shingles shot According to your history, you may be due to get a colonoscopy this fall. We will confirm this with the gastroenterologist to make sure that you are on scheduled to get this. Use warm wet compresses to the shoulder Please consider the diet program you are discussing and continue to watch sodium intake closely We will give you a prescription for you to send to [facility] for Viagra generic 20 mg pills with refills

## 2015-01-12 LAB — BMP8+EGFR
BUN/Creatinine Ratio: 15 (ref 9–20)
BUN: 16 mg/dL (ref 6–24)
CO2: 25 mmol/L (ref 18–29)
CREATININE: 1.06 mg/dL (ref 0.76–1.27)
Calcium: 9.4 mg/dL (ref 8.7–10.2)
Chloride: 99 mmol/L (ref 97–108)
GFR calc Af Amer: 88 mL/min/{1.73_m2} (ref >59)
GFR calc non Af Amer: 76 mL/min/{1.73_m2} (ref >59)
Glucose: 113 mg/dL — ABNORMAL HIGH (ref 65–99)
POTASSIUM: 4.4 mmol/L (ref 3.5–5.2)
Sodium: 139 mmol/L (ref 134–144)

## 2015-01-12 LAB — NMR, LIPOPROFILE
CHOLESTEROL: 194 mg/dL (ref 100–199)
HDL Cholesterol by NMR: 54 mg/dL (ref >39)
HDL Particle Number: 33.1 umol/L (ref >=30.5)
LDL PARTICLE NUMBER: 1614 nmol/L — AB (ref <1000)
LDL Size: 20.8 nm (ref >20.5)
LDL-C: 125 mg/dL — ABNORMAL HIGH (ref 0–99)
LP-IR Score: 42 (ref <=45)
SMALL LDL PARTICLE NUMBER: 670 nmol/L — AB (ref <=527)
Triglycerides by NMR: 74 mg/dL (ref 0–149)

## 2015-01-12 LAB — HEPATIC FUNCTION PANEL
ALT: 18 IU/L (ref 0–44)
AST: 21 IU/L (ref 0–40)
Albumin: 4.3 g/dL (ref 3.5–5.5)
Alkaline Phosphatase: 71 IU/L (ref 39–117)
BILIRUBIN TOTAL: 0.4 mg/dL (ref 0.0–1.2)
Bilirubin, Direct: 0.1 mg/dL (ref 0.00–0.40)
TOTAL PROTEIN: 6.9 g/dL (ref 6.0–8.5)

## 2015-01-12 LAB — VITAMIN D 25 HYDROXY (VIT D DEFICIENCY, FRACTURES): Vit D, 25-Hydroxy: 61.1 ng/mL (ref 30.0–100.0)

## 2015-01-17 ENCOUNTER — Other Ambulatory Visit: Payer: Self-pay | Admitting: *Deleted

## 2015-01-17 ENCOUNTER — Telehealth: Payer: Self-pay | Admitting: *Deleted

## 2015-01-17 MED ORDER — ROSUVASTATIN CALCIUM 10 MG PO TABS
10.0000 mg | ORAL_TABLET | Freq: Every day | ORAL | Status: DC
Start: 2015-01-17 — End: 2015-07-19

## 2015-01-17 NOTE — Telephone Encounter (Signed)
-----   Message from Chipper Herb, MD sent at 01/12/2015 10:21 AM EST ----- The blood sugars elevated at 113. This number should be less than 100. The creatinine, the most important kidney function test is within normal limits. The electrolytes including potassium are within normal limits. All liver function tests are within normal limits The total LDL particle number is higher than it has been over the past year. The number is [id number]. The LDL C is elevated at 125. The triglycerides are good.------- please reemphasized aggressive therapeutic lifestyle changes which include diet and exercise and weight loss. The patient has been intolerant to atorvastatin in the past. Please try Crestor 10 mg with a savings coupon so that he can get a 3 month supply for $3. He would take the Crestor at bedtime. A prescription for 90. He would need to have liver function tests checked 4 weeks after starting the Crestor. If he develops aches pains or myalgias he should discontinue the medication. The vitamin D level is good, he should continue his current treatment for vitamin D which includes 5000 units daily

## 2015-01-17 NOTE — Telephone Encounter (Signed)
A ware of results and will begin crestor.

## 2015-01-17 NOTE — Telephone Encounter (Signed)
Please call to discuss lab results and medications.

## 2015-02-05 ENCOUNTER — Other Ambulatory Visit: Payer: Self-pay | Admitting: Family Medicine

## 2015-03-29 ENCOUNTER — Other Ambulatory Visit: Payer: Self-pay | Admitting: *Deleted

## 2015-03-29 MED ORDER — AMLODIPINE BESYLATE-VALSARTAN 5-320 MG PO TABS: ORAL_TABLET | ORAL | Status: DC

## 2015-04-01 ENCOUNTER — Encounter: Payer: Self-pay | Admitting: *Deleted

## 2015-04-04 ENCOUNTER — Other Ambulatory Visit: Payer: Self-pay | Admitting: Family Medicine

## 2015-04-18 ENCOUNTER — Telehealth: Payer: Self-pay | Admitting: Nurse Practitioner

## 2015-04-18 ENCOUNTER — Telehealth: Payer: Self-pay | Admitting: Family Medicine

## 2015-04-18 MED ORDER — AZELASTINE HCL 0.15 % NA SOLN: NASAL | Status: DC

## 2015-04-18 NOTE — Telephone Encounter (Signed)
Please refill this for one year  

## 2015-04-18 NOTE — Telephone Encounter (Signed)
Patient is requesting refill on azelastine.

## 2015-04-18 NOTE — Telephone Encounter (Signed)
Patient aware rx called in  

## 2015-04-18 NOTE — Telephone Encounter (Signed)
error 

## 2015-05-10 ENCOUNTER — Other Ambulatory Visit: Payer: Self-pay | Admitting: Family Medicine

## 2015-05-10 NOTE — Telephone Encounter (Signed)
Last seen 01/11/15 [facility]

## 2015-05-10 NOTE — Telephone Encounter (Signed)
This is okay to refill with refills for one year

## 2015-05-18 ENCOUNTER — Telehealth: Payer: Self-pay | Admitting: Family Medicine

## 2015-05-18 DIAGNOSIS — M25561 Pain in right knee: Secondary | ICD-10-CM

## 2015-05-20 NOTE — Telephone Encounter (Signed)
Referral placed.

## 2015-07-18 ENCOUNTER — Ambulatory Visit: Payer: PRIVATE HEALTH INSURANCE | Admitting: Family Medicine

## 2015-07-19 ENCOUNTER — Encounter: Payer: Self-pay | Admitting: Family Medicine

## 2015-07-19 ENCOUNTER — Ambulatory Visit (INDEPENDENT_AMBULATORY_CARE_PROVIDER_SITE_OTHER): Payer: PRIVATE HEALTH INSURANCE | Admitting: Family Medicine

## 2015-07-19 VITALS — BP 139/90 | HR 64 | Temp 97.4°F | Ht 72.0 in | Wt 203.2 lb

## 2015-07-19 DIAGNOSIS — Z8249 Family history of ischemic heart disease and other diseases of the circulatory system: Secondary | ICD-10-CM | POA: Diagnosis not present

## 2015-07-19 DIAGNOSIS — M542 Cervicalgia: Secondary | ICD-10-CM

## 2015-07-19 DIAGNOSIS — M25511 Pain in right shoulder: Secondary | ICD-10-CM | POA: Diagnosis not present

## 2015-07-19 DIAGNOSIS — I10 Essential (primary) hypertension: Secondary | ICD-10-CM

## 2015-07-19 DIAGNOSIS — M1711 Unilateral primary osteoarthritis, right knee: Secondary | ICD-10-CM | POA: Diagnosis not present

## 2015-07-19 DIAGNOSIS — Z0000 Encounter for general adult medical examination without abnormal findings: Secondary | ICD-10-CM | POA: Diagnosis not present

## 2015-07-19 DIAGNOSIS — E8881 Metabolic syndrome: Secondary | ICD-10-CM

## 2015-07-19 DIAGNOSIS — E559 Vitamin D deficiency, unspecified: Secondary | ICD-10-CM | POA: Diagnosis not present

## 2015-07-19 LAB — POCT CBC
Granulocyte percent: 77.5 %G (ref 37–80)
HCT, POC: 49.2 % (ref 43.5–53.7)
Hemoglobin: 15.5 g/dL (ref 14.1–18.1)
Lymph, poc: 1.5 (ref 0.6–3.4)
MCH, POC: 29.3 pg (ref 27–31.2)
MCHC: 31.6 g/dL — AB (ref 31.8–35.4)
MCV: 92.7 fL (ref 80–97)
MPV: 8.2 fL (ref 0–99.8)
POC Granulocyte: 6 (ref 2–6.9)
POC LYMPH %: 18.7 % (ref 10–50)
Platelet Count, POC: 240 10*3/uL (ref 142–424)
RBC: 5.31 M/uL (ref 4.69–6.13)
RDW, POC: 12 %
WBC: 7.8 10*3/uL (ref 4.6–10.2)

## 2015-07-19 MED ORDER — ROSUVASTATIN CALCIUM 10 MG PO TABS: 10.0000 mg | ORAL_TABLET | Freq: Every day | ORAL | Status: DC

## 2015-07-19 MED ORDER — HYDROCHLOROTHIAZIDE 25 MG PO TABS: ORAL_TABLET | ORAL | Status: DC

## 2015-07-19 MED ORDER — AMLODIPINE BESYLATE-VALSARTAN 5-320 MG PO TABS: ORAL_TABLET | ORAL | Status: DC

## 2015-07-19 NOTE — Patient Instructions (Addendum)
Continue current medications. Continue good therapeutic lifestyle changes which include good diet and exercise. Fall precautions discussed with patient. If an FOBT was given today- please return it to our front desk. If you are over 60 years old - you may need Prevnar 70 or the adult Pneumonia vaccine.   After your visit with [place] today you will receive a survey in the mail or online from [employer] regarding your care with [place]. Please take a moment to fill this out. Your feedback is very important to [place] as you can help [place] better understand your patient needs as well as improve your experience and satisfaction. WE CARE ABOUT YOU!!!   Because of the patient's continued complaint with neck pain and right shoulder pain and the abnormality of previous films of the cervical spine and shoulder we will arrange for him to have an MRI of the C-spine and right shoulder. He is also scheduled in early October to have his knee replaced on the right side. We will get a cardiac clearance because his father who was not a smoker had a heart attack in his 43s. The patient currently has no chest pain or chest tightness. He should continue with follow-up as planned by the urologist Dr. Anthoney Harada. We will send a copy of PSA result to him.

## 2015-07-19 NOTE — Progress Notes (Signed)
Subjective:    Patient ID: Norman Briggs, male    DOB: [birth year], 59 y.o.   MRN: [id number]  HPI   Patient is here today for annual wellness exam and follow up of chronic medical problems which include hypertension.  Patient is taking his prescriptions regularly as prescribed. It is important to note that the patient is being scheduled for a right knee replacement because of osteoarthritis. He is followed regularly by his urologist and has had an exam by them and everything is stable in that regard. He is requesting refills on 3 of his medications. He is also complaining of some right shoulder pain. He will get lab work done today and we will make sure that he gets a copy of this for his orthopedic surgeon to review. In the review of systems, the patient denies chest pain shortness of breath problems with his GI tract or problems with voiding.     Patient Active Problem List   Diagnosis Date Noted  . Metabolic syndrome 40/98/1191  . Hyperlipemia 07/02/2013  . Hypertension 07/02/2013  . Allergic rhinitis 07/02/2013  . History of elevated PSA 07/02/2013  . ED (erectile dysfunction) 07/02/2013  . Vitamin D deficiency 07/02/2013   Outpatient Encounter Prescriptions as of 07/19/2015  Medication Sig  . amLODipine-valsartan (EXFORGE) 5-320 MG per tablet TAKE 1 TABLET BY MOUTH EVERY DAY  . aspirin EC 81 MG tablet Take 81 mg by mouth daily.  . Azelastine HCl (ASTEPRO) 0.15 % SOLN USE 1 SPRAYS IN EACH NOSTRIL AR BEDTIME  . Cholecalciferol (VITAMIN D-3) 5000 UNITS TABS Take 1 tablet by mouth daily.  . hydrochlorothiazide (HYDRODIURIL) 25 MG tablet TAKE 1 TABLET BY MOUTH EVERY DAY AS DIRECTED (Patient taking differently: take 1/2 tablet per day as directed)  . meloxicam (MOBIC) 15 MG tablet TAKE 1 TABLET (15 MG TOTAL) BY MOUTH DAILY. AFTER A MEAL  . Multiple Vitamins-Minerals (EYE-VITE PLUS LUTEIN) CAPS Take 1 capsule by mouth daily.   . Omega-3 Fatty Acids (FISH OIL) 1000 MG CAPS Take 2 capsules  by mouth daily.  . rosuvastatin (CRESTOR) 10 MG tablet Take 1 tablet (10 mg total) by mouth daily.  . sildenafil (REVATIO) 20 MG tablet TAKE 2-5 TABLETS BY MOUTH ONCE DAILY AS NEEDED FOR SEXUAL ACTIVITY  . triamcinolone (NASACORT) 55 MCG/ACT nasal inhaler USE 1 SPRAY IN EACH NOSTRIL DAILY   No facility-administered encounter medications on file as of 07/19/2015.     Review of Systems  Constitutional: Negative.   HENT: Negative.   Eyes: Negative.   Respiratory: Negative.   Cardiovascular: Negative.   Gastrointestinal: Negative.   Endocrine: Negative.   Genitourinary: Negative.   Musculoskeletal: Positive for arthralgias (right knee pain and right shoulder pain).  Skin: Negative.   Allergic/Immunologic: Negative.   Neurological: Negative.   Hematological: Negative.   Psychiatric/Behavioral: Negative.        Objective:   Physical Exam  Constitutional: He is oriented to person, place, and time. He appears well-developed and well-nourished. No distress.  HENT:  Head: Normocephalic and atraumatic.  Right Ear: External ear normal.  Left Ear: External ear normal.  Nose: Nose normal.  Mouth/Throat: Oropharynx is clear and moist. No oropharyngeal exudate.  Eyes: Conjunctivae and EOM are normal. Pupils are equal, round, and reactive to light. Right eye exhibits no discharge. Left eye exhibits no discharge. No scleral icterus.  Neck: Normal range of motion. Neck supple. No thyromegaly present.  No carotid bruits or thyromegaly  Cardiovascular: Normal rate, regular rhythm, normal heart sounds  and intact distal pulses.   No murmur heard. At 72/m  Pulmonary/Chest: Effort normal and breath sounds normal. No respiratory distress. He has no wheezes. He has no rales. He exhibits no tenderness.  Abdominal: Soft. Bowel sounds are normal. He exhibits no mass. There is no tenderness. There is no rebound and no guarding.  No abdominal tenderness or organ enlargement or bruits  Genitourinary:  This  is done by the urologist regularly and has been done recently about 2 weeks ago. And everything was good.  Musculoskeletal: Normal range of motion. He exhibits no edema or tenderness.  Minimal joint line tenderness or swelling in the right knee that is to be operated on.  Lymphadenopathy:    He has no cervical adenopathy.  Neurological: He is alert and oriented to person, place, and time. He has normal reflexes. No cranial nerve deficit.  Skin: Skin is warm and dry. No rash noted. No erythema. No pallor.  Psychiatric: He has a normal mood and affect. His behavior is normal. Judgment and thought content normal.  Nursing note and vitals reviewed.    BP 139/90 mmHg  Pulse 64  Temp(Src) 97.4 F (36.3 C) (Oral)  Ht 6' (1.829 m)  Wt 203 lb 3.2 oz (92.171 kg)  BMI 27.55 kg/m2  Repeat blood pressure right arm large cuff 136/90       Assessment & Plan:  1. Essential hypertension -According to patient home blood pressures have been running in the 120s over the 70s and there will be no change in his blood pressure treatment today. - NMR, lipoprofile - BMP8+EGFR - POCT CBC - Hepatic function panel - Ambulatory referral to Cardiology  2. Vitamin D deficiency -He should continue with his current vitamin D replacement pending results of lab work - POCT CBC - Vit D  25 hydroxy (rtn osteoporosis monitoring)  3. Metabolic syndrome -He should continue to exercise as much as possible and watch his diet as closely as possible to keep his weight down as much as possible - POCT CBC - Ambulatory referral to Cardiology  4. Annual physical exam -Pending knee replacement and cardiac clearance. -Also because of continued pain in the neck and right shoulder and previous abnormal x-rays we will get an MRI of the right shoulder and cervical spine. He was given copies of the plain film reports - PSA - NMR, lipoprofile - BMP8+EGFR - POCT CBC - Hepatic function panel - Vit D  25 hydroxy (rtn  osteoporosis monitoring) - EKG 12-Lead  5. Family history of heart disease -The patient's father died of a heart attack in his 25s. - Ambulatory referral to Cardiology  6. Right shoulder pain -This is been going on for a good while with no relief with anti-inflammatory medications. The x-rays in January indicated possible rotator cuff disease. - MR Cervical Spine Wo Contrast; Future - MR Shoulder Right Wo Contrast; Future  7. Neck pain -This is been going on for a good while also. And the x-rays were abnormal in January. - MR Cervical Spine Wo Contrast; Future - MR Shoulder Right Wo Contrast; Future  8. Primary osteoarthritis of right knee -Surgery is being planned in early October on the right knee by his orthopedic surgeon in [facility].  Meds ordered this encounter  Medications  . rosuvastatin (CRESTOR) 10 MG tablet    Sig: Take 1 tablet (10 mg total) by mouth daily.    Dispense:  90 tablet    Refill:  3  . amLODipine-valsartan (EXFORGE) 5-320 MG  per tablet    Sig: TAKE 1 TABLET BY MOUTH EVERY DAY    Dispense:  30 tablet    Refill:  11  . hydrochlorothiazide (HYDRODIURIL) 25 MG tablet    Sig: take 1/2 tablet per day as directed    Dispense:  30 tablet    Refill:  11   Patient Instructions  Continue current medications. Continue good therapeutic lifestyle changes which include good diet and exercise. Fall precautions discussed with patient. If an FOBT was given today- please return it to our front desk. If you are over 75 years old - you may need Prevnar 71 or the adult Pneumonia vaccine.   After your visit with [place] today you will receive a survey in the mail or online from [employer] regarding your care with [place]. Please take a moment to fill this out. Your feedback is very important to [place] as you can help [place] better understand your patient needs as well as improve your experience and satisfaction. WE CARE ABOUT YOU!!!   Because of the patient's continued complaint with  neck pain and right shoulder pain and the abnormality of previous films of the cervical spine and shoulder we will arrange for him to have an MRI of the C-spine and right shoulder. He is also scheduled in early October to have his knee replaced on the right side. We will get a cardiac clearance because his father who was not a smoker had a heart attack in his 38s. The patient currently has no chest pain or chest tightness. He should continue with follow-up as planned by the urologist Dr. Anthoney Harada. We will send a copy of PSA result to him.   Arrie Senate MD

## 2015-07-20 LAB — HEPATIC FUNCTION PANEL
ALT: 29 IU/L (ref 0–44)
AST: 25 IU/L (ref 0–40)
Albumin: 4.5 g/dL (ref 3.5–5.5)
Alkaline Phosphatase: 69 IU/L (ref 39–117)
BILIRUBIN, DIRECT: 0.16 mg/dL (ref 0.00–0.40)
Bilirubin Total: 0.5 mg/dL (ref 0.0–1.2)
TOTAL PROTEIN: 6.9 g/dL (ref 6.0–8.5)

## 2015-07-20 LAB — NMR, LIPOPROFILE
Cholesterol: 124 mg/dL (ref 100–199)
HDL Cholesterol by NMR: 55 mg/dL
HDL Particle Number: 34.9 umol/L
LDL Particle Number: 605 nmol/L
LDL Size: 20.1 nm
LDL-C: 51 mg/dL (ref 0–99)
LP-IR Score: 37
Small LDL Particle Number: 320 nmol/L
Triglycerides by NMR: 91 mg/dL (ref 0–149)

## 2015-07-20 LAB — BMP8+EGFR
BUN/Creatinine Ratio: 18 (ref 9–20)
BUN: 19 mg/dL (ref 6–24)
CO2: 25 mmol/L (ref 18–29)
Calcium: 9 mg/dL (ref 8.7–10.2)
Chloride: 99 mmol/L (ref 97–108)
Creatinine, Ser: 1.07 mg/dL (ref 0.76–1.27)
GFR calc Af Amer: 87 mL/min/1.73
GFR calc non Af Amer: 76 mL/min/1.73
Glucose: 100 mg/dL — ABNORMAL HIGH (ref 65–99)
Potassium: 4.1 mmol/L (ref 3.5–5.2)
Sodium: 138 mmol/L (ref 134–144)

## 2015-07-20 LAB — PSA: Prostate Specific Ag, Serum: 1.4 ng/mL (ref 0.0–4.0)

## 2015-07-20 LAB — VITAMIN D 25 HYDROXY (VIT D DEFICIENCY, FRACTURES): Vit D, 25-Hydroxy: 55.1 ng/mL (ref 30.0–100.0)

## 2015-07-26 ENCOUNTER — Other Ambulatory Visit: Payer: PRIVATE HEALTH INSURANCE

## 2015-07-26 ENCOUNTER — Telehealth: Payer: Self-pay | Admitting: *Deleted

## 2015-07-26 DIAGNOSIS — Z1212 Encounter for screening for malignant neoplasm of rectum: Secondary | ICD-10-CM

## 2015-07-26 MED ORDER — DIAZEPAM 2 MG PO TABS: ORAL_TABLET | ORAL | Status: DC

## 2015-07-26 NOTE — Progress Notes (Signed)
Lab only 

## 2015-07-26 NOTE — Telephone Encounter (Signed)
Valium 2 mg 1 or 2 pills and hour or so prior to the procedure

## 2015-07-26 NOTE — Telephone Encounter (Signed)
Pt came by today to discuss up coming MRI of neck and shoulder He said that he doesn't like small spaces and wonders if you can call in a pill or 2 for anxiety to take prior to MRI    Call into [facility] if approved

## 2015-07-27 ENCOUNTER — Telehealth: Payer: Self-pay | Admitting: Cardiovascular Disease

## 2015-07-27 NOTE — Telephone Encounter (Signed)
Received records from [facility] for appointment with Dr Oval Linsey on 08/15/15.  Records given to [facility] (medical records) for Dr Blenda Mounts schedule on 08/15/15. lp

## 2015-07-28 ENCOUNTER — Other Ambulatory Visit: Payer: Self-pay | Admitting: *Deleted

## 2015-07-28 DIAGNOSIS — R195 Other fecal abnormalities: Secondary | ICD-10-CM

## 2015-07-28 LAB — FECAL OCCULT BLOOD, IMMUNOCHEMICAL: Fecal Occult Bld: POSITIVE — AB

## 2015-08-04 ENCOUNTER — Ambulatory Visit
Admission: RE | Admit: 2015-08-04 | Discharge: 2015-08-04 | Disposition: A | Discharge status: Home / Self Care | Payer: 59 | Source: Ambulatory Visit | Attending: Family Medicine | Admitting: Family Medicine

## 2015-08-04 DIAGNOSIS — M25511 Pain in right shoulder: Secondary | ICD-10-CM

## 2015-08-04 DIAGNOSIS — M542 Cervicalgia: Secondary | ICD-10-CM

## 2015-08-05 ENCOUNTER — Other Ambulatory Visit: Payer: Self-pay | Admitting: *Deleted

## 2015-08-05 DIAGNOSIS — M47812 Spondylosis without myelopathy or radiculopathy, cervical region: Secondary | ICD-10-CM

## 2015-08-05 DIAGNOSIS — M4802 Spinal stenosis, cervical region: Secondary | ICD-10-CM

## 2015-08-10 ENCOUNTER — Other Ambulatory Visit: Payer: Self-pay | Admitting: Family Medicine

## 2015-08-14 NOTE — Progress Notes (Signed)
Cardiology Office Note   Date:  08/15/2015   ID:  Norman Briggs, DOB 09/05/1956, MRN [id number]  PCP:  Norman Gainer, MD  Cardiologist:   Norman Harness, MD   Chief Complaint  Patient presents with  . New Evaluation    have cramps in his back  . Medical Clearance    clearance for knee surgery w/Dr. Onnie Briggs in [place]       History of Present Illness: Norman Briggs is a 59 y.o. male with hypertension and hyperlipidemia who presents for pre-surgical cardiac risk assessment.  Norman Briggs is scheduled for R knee arthroplasty with Dr. Salvadore Briggs.  Norman Briggs denies CP or SOB.  He previously walked for 2 miles every day. However he has been unable to do so recently due to leg pain. He is able to walk up more than one flight of stairs and on flat land for several blocks without any shortness of breath or chest pain. 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  He denies orthopnea, PND, LE edema or palpitations.  Of note, Norman Briggs' father had a heat attack at age 63.  Norman Briggs noes that when turning his head in the car he sometimes feels lightheaded.  It also occurs when laughing hard on the golf course.  He has not passed out.  Past Medical History  Diagnosis Date  . Hyperlipidemia   . Hypertension   . Vitamin D deficiency     Past Surgical History  Procedure Laterality Date  . Appendectomy    . Lefgt wrist       reapair- glass injury  . Knee surgery      x2  . Pilonidal cyst excision       Current Outpatient Prescriptions  Medication Sig Dispense Refill  . amLODipine-valsartan (EXFORGE) 5-320 MG per tablet TAKE 1 TABLET BY MOUTH EVERY DAY 30 tablet 11  . aspirin EC 81 MG tablet Take 81 mg by mouth daily.    . Azelastine HCl (ASTEPRO) 0.15 % SOLN USE 1 SPRAYS IN EACH NOSTRIL AR BEDTIME 30 mL 11  . Cholecalciferol (VITAMIN D-3) 5000 UNITS TABS Take 1 tablet by mouth daily.    . hydrochlorothiazide (HYDRODIURIL) 25  MG tablet take 1/2 tablet per day as directed 30 tablet 11  . meloxicam (MOBIC) 15 MG tablet TAKE 1 TABLET (15 MG TOTAL) BY MOUTH DAILY. AFTER A MEAL 30 tablet 2  . Multiple Vitamins-Minerals (EYE-VITE PLUS LUTEIN) CAPS Take 1 capsule by mouth daily.     . Omega-3 Fatty Acids (FISH OIL) 1000 MG CAPS Take 2 capsules by mouth daily.    . rosuvastatin (CRESTOR) 10 MG tablet Take 1 tablet (10 mg total) by mouth daily. 90 tablet 3  . sildenafil (REVATIO) 20 MG tablet TAKE 2-5 TABLETS BY MOUTH ONCE DAILY AS NEEDED FOR SEXUAL ACTIVITY 50 tablet 0  . triamcinolone (NASACORT) 55 MCG/ACT nasal inhaler USE 1 SPRAY IN EACH NOSTRIL DAILY 16.5 g 3   No current facility-administered medications for this visit.    Allergies:   Lipitor; Anaprox; and Lisinopril    Social History:  The patient  reports that he quit smoking about 5 years ago. His smoking use included Cigarettes. He smoked 0.50 packs per day. He does not have any smokeless tobacco history on file. He reports that he drinks about 4.0 oz of alcohol per week. He reports that he does not use illicit drugs.  Family History:  The patient's family history includes Heart attack in his father; Hypertension in his mother.    ROS:  Please see the history of present illness.   Otherwise, review of systems are positive for neck and arm pain, R knee pain and crepitus..   All other systems are reviewed and negative.    PHYSICAL EXAM: VS:  BP 120/86 mmHg  Pulse 71  Ht 5\' 11"  (1.803 m)  Wt 92.715 kg (204 lb 6.4 oz)  BMI 28.52 kg/m2 , BMI Body mass index is 28.52 kg/(m^2). GENERAL:  Well appearing HEENT:  Pupils equal round and reactive, fundi not visualized, oral mucosa unremarkable NECK:  No jugular venous distention, waveform within normal limits, carotid upstroke brisk and symmetric, no bruits, no thyromegaly LYMPHATICS:  No cervical adenopathy LUNGS:  Clear to auscultation bilaterally HEART:  RRR.  PMI not displaced or sustained,S1 and S2 within  normal limits, no S3, no S4, no clicks, no rubs, no murmurs ABD:  Flat, positive bowel sounds normal in frequency in pitch, no bruits, no rebound, no guarding, no midline pulsatile mass, no hepatomegaly, no splenomegaly EXT:  2 plus pulses throughout, no edema, no cyanosis no clubbing SKIN:  No rashes no nodules NEURO:  Cranial nerves II through XII grossly intact, motor grossly intact throughout PSYCH:  Cognitively intact, oriented to person place and time    EKG:  EKG is ordered today. The ekg ordered today demonstrates sinus rhythm at 71 bpm.   Recent Labs: 07/19/2015: ALT 29; BUN 19; Creatinine, Ser 1.07; Hemoglobin 15.5; Potassium 4.1; Sodium 138    Lipid Panel    Component Value Date/Time   CHOL 124 07/19/2015 1150   CHOL 116 07/02/2013 0930   TRIG 91 07/19/2015 1150   TRIG 71 07/02/2013 0930   HDL 55 07/19/2015 1150   HDL 45 07/02/2013 0930   LDLCALC 98 07/08/2014 0849   LDLCALC 57 07/02/2013 0930      Wt Readings from Last 3 Encounters:  08/15/15 92.715 kg (204 lb 6.4 oz)  07/19/15 92.171 kg (203 lb 3.2 oz)  01/11/15 91.627 kg (202 lb)      Other studies Reviewed: Additional studies/ records that were reviewed today include: medical records. Review of the above records demonstrates:  Please see elsewhere in the note.     ASSESSMENT AND PLAN:  # Pre-surgical risk: The patient does not have any unstable cardiac conditions.  Upon evaluation today, he can achieve 4 METs or greater without anginal symptoms.  According to [facility] and AHA guidelines, he requires no further cardiac workup prior to his noncardiac surgery and should be at acceptable risk.  Our service is available as necessary in the perioperative period.  NSQIP perioperative MI or cardiac arrest risk is 0.14%.  # Pre-syncope: Symptoms are concerning for carotid sinus hypersensitivity vs carotid artery disease, as it always happens when turning his neck. - Carotid ultrasound  # Hypertension: BP  well-controlled.  No change to current amlodipine, valsartan, HCTZ regimen.  # Hyperlipidemia: Lipids well-controlled on rosuvastatin.  10 year ASCVD risk is 6.6%.  Agree with moderate dose statin therapy.    # CV Disease prevention: Given that Mr. Dorantes' 10 year ASCVD risk is >5%, and that this this value was obtained already on statin therapy, aree with continuin aspirin 81mg  daily for primary prevention.   Current medicines are reviewed at length with the patient today.  The patient does not have concerns regarding medicines.  The following changes have been made:  no  change  Labs/ tests ordered today include: carotid ultrasound   Orders Placed This Encounter  Procedures  . EKG 12-Lead     Disposition:   FU with Dr. Jonelle Sidle C. Nome prn.   Signed, Norman Harness, MD  08/15/2015 10:53 AM    [facility]

## 2015-08-15 ENCOUNTER — Other Ambulatory Visit: Payer: Self-pay | Admitting: Cardiovascular Disease

## 2015-08-15 ENCOUNTER — Ambulatory Visit (INDEPENDENT_AMBULATORY_CARE_PROVIDER_SITE_OTHER): Payer: 59 | Admitting: Cardiovascular Disease

## 2015-08-15 ENCOUNTER — Encounter: Payer: Self-pay | Admitting: Cardiovascular Disease

## 2015-08-15 VITALS — BP 120/86 | HR 71 | Ht 71.0 in | Wt 204.4 lb

## 2015-08-15 DIAGNOSIS — R55 Syncope and collapse: Secondary | ICD-10-CM | POA: Diagnosis not present

## 2015-08-15 DIAGNOSIS — R42 Dizziness and giddiness: Secondary | ICD-10-CM

## 2015-08-15 DIAGNOSIS — Z01818 Encounter for other preprocedural examination: Secondary | ICD-10-CM | POA: Diagnosis not present

## 2015-08-15 NOTE — Patient Instructions (Addendum)
Your physician has requested that you have a carotid duplex. This test is an ultrasound of the carotid arteries in your neck. It looks at blood flow through these arteries that supply the brain with blood. Allow one hour for this exam. There are no restrictions or special instructions.  Dr Oval Linsey recommends that follow-up with her as needed.

## 2015-08-23 ENCOUNTER — Ambulatory Visit (HOSPITAL_COMMUNITY)
Admission: RE | Admit: 2015-08-23 | Discharge: 2015-08-23 | Disposition: A | Discharge status: Home / Self Care | Payer: 59 | Source: Ambulatory Visit | Attending: Cardiovascular Disease | Admitting: Cardiovascular Disease

## 2015-08-23 DIAGNOSIS — R42 Dizziness and giddiness: Secondary | ICD-10-CM | POA: Diagnosis not present

## 2015-08-23 DIAGNOSIS — I6523 Occlusion and stenosis of bilateral carotid arteries: Secondary | ICD-10-CM | POA: Insufficient documentation

## 2015-08-23 DIAGNOSIS — R55 Syncope and collapse: Secondary | ICD-10-CM | POA: Diagnosis present

## 2015-08-26 ENCOUNTER — Telehealth: Payer: Self-pay | Admitting: *Deleted

## 2015-08-26 DIAGNOSIS — I6523 Occlusion and stenosis of bilateral carotid arteries: Secondary | ICD-10-CM

## 2015-08-26 NOTE — Telephone Encounter (Signed)
Spoke to patient. Result given . Verbalized understanding  AWARE AN ORDER PLACED FOR 2 YEARS FOR FOLLOW UP DOPPLER.

## 2015-08-26 NOTE — Telephone Encounter (Signed)
-----   Message from Skeet Latch, MD sent at 08/25/2015  5:14 PM EDT ----- Mild blockage in the carotid arteries on both sides.  Will repeat the test in 2 years.  Continue the aspirin and statin which will help prevent this from getting worse.

## 2015-09-01 ENCOUNTER — Other Ambulatory Visit: Payer: PRIVATE HEALTH INSURANCE

## 2015-09-01 ENCOUNTER — Telehealth: Payer: Self-pay | Admitting: Family Medicine

## 2015-09-01 DIAGNOSIS — Z1212 Encounter for screening for malignant neoplasm of rectum: Secondary | ICD-10-CM

## 2015-09-01 NOTE — Progress Notes (Signed)
Lab only 

## 2015-09-02 NOTE — Telephone Encounter (Signed)
Pt called - we discussed faxing surg clearance and this was done .

## 2015-09-04 LAB — FECAL OCCULT BLOOD, IMMUNOCHEMICAL: FECAL OCCULT BLD: NEGATIVE

## 2015-09-08 ENCOUNTER — Encounter: Payer: Self-pay | Admitting: Family Medicine

## 2015-12-28 ENCOUNTER — Telehealth: Payer: Self-pay | Admitting: Family Medicine

## 2015-12-28 MED ORDER — ROSUVASTATIN CALCIUM 10 MG PO TABS: 10.0000 mg | ORAL_TABLET | Freq: Every day | ORAL | Status: DC

## 2015-12-28 MED ORDER — HYDROCHLOROTHIAZIDE 25 MG PO TABS: ORAL_TABLET | ORAL | Status: DC

## 2015-12-28 NOTE — Telephone Encounter (Signed)
Pt called

## 2015-12-29 ENCOUNTER — Encounter: Payer: Self-pay | Admitting: Internal Medicine

## 2016-01-18 ENCOUNTER — Ambulatory Visit (INDEPENDENT_AMBULATORY_CARE_PROVIDER_SITE_OTHER): Payer: PRIVATE HEALTH INSURANCE

## 2016-01-18 ENCOUNTER — Encounter: Payer: Self-pay | Admitting: Family Medicine

## 2016-01-18 ENCOUNTER — Ambulatory Visit (INDEPENDENT_AMBULATORY_CARE_PROVIDER_SITE_OTHER): Payer: PRIVATE HEALTH INSURANCE | Admitting: Family Medicine

## 2016-01-18 VITALS — BP 117/76 | HR 79 | Temp 97.7°F | Ht 71.0 in | Wt 202.0 lb

## 2016-01-18 DIAGNOSIS — E785 Hyperlipidemia, unspecified: Secondary | ICD-10-CM | POA: Diagnosis not present

## 2016-01-18 DIAGNOSIS — Z8249 Family history of ischemic heart disease and other diseases of the circulatory system: Secondary | ICD-10-CM

## 2016-01-18 DIAGNOSIS — I10 Essential (primary) hypertension: Secondary | ICD-10-CM

## 2016-01-18 DIAGNOSIS — E559 Vitamin D deficiency, unspecified: Secondary | ICD-10-CM

## 2016-01-18 DIAGNOSIS — Z96651 Presence of right artificial knee joint: Secondary | ICD-10-CM

## 2016-01-18 DIAGNOSIS — E8881 Metabolic syndrome: Secondary | ICD-10-CM | POA: Diagnosis not present

## 2016-01-18 DIAGNOSIS — L719 Rosacea, unspecified: Secondary | ICD-10-CM | POA: Diagnosis not present

## 2016-01-18 MED ORDER — SILDENAFIL CITRATE 20 MG PO TABS: ORAL_TABLET | ORAL | Status: DC

## 2016-01-18 MED ORDER — DOXYCYCLINE HYCLATE 100 MG PO TABS: ORAL_TABLET | ORAL | Status: DC

## 2016-01-18 NOTE — Progress Notes (Signed)
Subjective:    Patient ID: Norman Briggs, male    DOB: 1956-09-01, 60 y.o.   MRN: [id number]  HPI Pt here for follow up and management of chronic medical problems which includes hypertension and hyperlipidemia. He is taking medications regularly. The patient is status post right knee replacement from October. He complains of some right shoulder pain and some facial redness and erythema. Patient denies chest pain shortness of breath trouble swallowing heartburn indigestion nausea vomiting diarrhea or blood in the stool. He was out for 2 months from the right knee replacement and has returned to work. He is complaining of severe right shoulder pain and has had problems with this for a good while and inability to raise his arm due to the pain. He would like to visit with an orthopedic surgeon and may need a referral for this. He is going to call back and let us know who he would like to have the referral with. He is currently taking meloxicam.      Patient Active Problem List   Diagnosis Date Noted  . Primary osteoarthritis of right knee 07/19/2015  . Metabolic syndrome 50/53/9767  . Hyperlipemia 07/02/2013  . Hypertension 07/02/2013  . Allergic rhinitis 07/02/2013  . History of elevated PSA 07/02/2013  . ED (erectile dysfunction) 07/02/2013  . Vitamin D deficiency 07/02/2013   Outpatient Encounter Prescriptions as of 01/18/2016  Medication Sig  . amLODipine-valsartan (EXFORGE) 5-320 MG per tablet TAKE 1 TABLET BY MOUTH EVERY DAY  . aspirin EC 81 MG tablet Take 81 mg by mouth daily.  . Cholecalciferol (VITAMIN D-3) 5000 UNITS TABS Take 1 tablet by mouth daily.  . hydrochlorothiazide (HYDRODIURIL) 25 MG tablet take 1 tablet per day as directed  . Multiple Vitamins-Minerals (EYE-VITE PLUS LUTEIN) CAPS Take 1 capsule by mouth daily.   . Omega-3 Fatty Acids (FISH OIL) 1000 MG CAPS Take 2 capsules by mouth daily.  . rosuvastatin (CRESTOR) 10 MG tablet Take 1 tablet (10 mg total) by mouth daily.  Generic  . sildenafil (REVATIO) 20 MG tablet TAKE 2-5 TABLETS BY MOUTH ONCE DAILY AS NEEDED FOR SEXUAL ACTIVITY  . [DISCONTINUED] Azelastine HCl (ASTEPRO) 0.15 % SOLN USE 1 SPRAYS IN EACH NOSTRIL AR BEDTIME (Patient not taking: Reported on 01/18/2016)  . [DISCONTINUED] meloxicam (MOBIC) 15 MG tablet TAKE 1 TABLET (15 MG TOTAL) BY MOUTH DAILY. AFTER A MEAL (Patient not taking: Reported on 01/18/2016)  . [DISCONTINUED] triamcinolone (NASACORT) 55 MCG/ACT nasal inhaler USE 1 SPRAY IN EACH NOSTRIL DAILY (Patient not taking: Reported on 01/18/2016)   No facility-administered encounter medications on file as of 01/18/2016.     Review of Systems  Constitutional: Negative.   HENT: Negative.   Eyes: Negative.   Respiratory: Negative.   Cardiovascular: Negative.   Gastrointestinal: Negative.   Endocrine: Negative.   Genitourinary: Negative.   Musculoskeletal: Positive for arthralgias (still having right shoulder pain).  Skin: Negative.        Facial redness   Allergic/Immunologic: Negative.   Neurological: Negative.   Hematological: Negative.   Psychiatric/Behavioral: Negative.        Objective:   Physical Exam  Constitutional: He is oriented to person, place, and time. He appears well-developed and well-nourished. No distress.  HENT:  Head: Normocephalic and atraumatic.  Right Ear: External ear normal.  Left Ear: External ear normal.  Mouth/Throat: Oropharynx is clear and moist. No oropharyngeal exudate.  Nasal congestion and erythema  Eyes: Conjunctivae and EOM are normal. Pupils are equal, round, and reactive to  light. Right eye exhibits no discharge. Left eye exhibits no discharge. No scleral icterus.  Neck: Normal range of motion. Neck supple. No thyromegaly present.  Neck without bruits or thyromegaly or adenopathy  Cardiovascular: Normal rate, regular rhythm, normal heart sounds and intact distal pulses.   No murmur heard. Pulmonary/Chest: Effort normal and breath sounds normal. No  respiratory distress. He has no wheezes. He has no rales. He exhibits no tenderness.  Clear anteriorly and posteriorly  Abdominal: Soft. Bowel sounds are normal. He exhibits no mass. There is no tenderness. There is no rebound and no guarding.  No abdominal tenderness masses or organ enlargement  Genitourinary: Rectum normal.  Musculoskeletal: He exhibits edema and tenderness.  The patient is recovering well from his right knee replacement and has only minimal swelling and minimal rubor. The incision line is well-healed. He does have an inability to raise the arm much above horizontal due to pain in his right shoulder he has a history of a right rotator cuff tear.  Lymphadenopathy:    He has no cervical adenopathy.  Neurological: He is alert and oriented to person, place, and time. He has normal reflexes. No cranial nerve deficit.  Skin: Skin is warm and dry. No rash noted.  Psychiatric: He has a normal mood and affect. His behavior is normal. Judgment and thought content normal.  Nursing note and vitals reviewed.   BP 117/76 mmHg  Pulse 79  Temp(Src) 97.7 F (36.5 C) (Oral)  Ht _0  (1.803 m)  Wt 202 lb (91.627 kg)  BMI 28.19 kg/m2  [facility] reading (PRIMARY) by  Dr. Brunilda Payor x-ray-results pending                                       Assessment & Plan:  1. Vitamin D deficiency -Continue current treatment pending results of lab work - CBC with Differential/Platelet - VITAMIN D 25 Hydroxy (Vit-D Deficiency, Fractures)  2. Essential hypertension -The blood pressure is good today and he will continue with current treatment - BMP8+EGFR - CBC with Differential/Platelet - Hepatic function panel - DG Chest 2 View; Future  3. Metabolic syndrome -Continue with diet and weight loss and exercise as much as tolerated - BMP8+EGFR - CBC with Differential/Platelet  4. Family history of heart disease -The patient should work aggressively with his diet keep his weight down the  cholesterol under control because of the family history of heart disease with his dad - CBC with Differential/Platelet - DG Chest 2 View; Future  5. Hyperlipemia -Continue Crestor and aggressive therapeutic lifestyle changes - CBC with Differential/Platelet - NMR, lipoprofile  6. Status post right knee replacement -Continue follow-up with orthopedic surgeon - CBC with Differential/Platelet  7. Acne rosacea -Take the doxycycline as directed and if necessary call us regarding a visit with the dermatologist. - doxycycline (VIBRA-TABS) 100 MG tablet; Take 1 twice daily or as directed with food  Dispense: 60 tablet; Refill: 0.patin  Patient Instructions  Continue current medications. Continue good therapeutic lifestyle changes which include good diet and exercise. Fall precautions discussed with patient. If an FOBT was given today- please return it to our front desk. If you are over 67 years old - you may need Prevnar 35 or the adult Pneumonia vaccine.  **Flu shots are available--- please call and schedule a FLU-CLINIC appointment**  After your visit with [place] today you will receive a survey in the mail or  online from [employer] regarding your care with [place]. Please take a moment to fill this out. Your feedback is very important to [place] as you can help [place] better understand your patient needs as well as improve your experience and satisfaction. WE CARE ABOUT YOU!!!   Continue to follow-up with orthopedic surgeon and continue with physical therapy as he directs For the remainder of the winter continue to drink plenty of fluids and stay well hydrated Take the doxycycline 100 mg twice daily for 1 week with food and then take one half daily with food after that If problems continue with the face make an appointment with the dermatologist Please call us and let us know if you need appointments to be made with orthopedics for shoulder evaluation for rotator cuff      Arrie Senate MD

## 2016-01-18 NOTE — Patient Instructions (Addendum)
Continue current medications. Continue good therapeutic lifestyle changes which include good diet and exercise. Fall precautions discussed with patient. If an FOBT was given today- please return it to our front desk. If you are over 61 years old - you may need Prevnar 61 or the adult Pneumonia vaccine.  **Flu shots are available--- please call and schedule a FLU-CLINIC appointment**  After your visit with [place] today you will receive a survey in the mail or online from [employer] regarding your care with [place]. Please take a moment to fill this out. Your feedback is very important to [place] as you can help [place] better understand your patient needs as well as improve your experience and satisfaction. WE CARE ABOUT YOU!!!   Continue to follow-up with orthopedic surgeon and continue with physical therapy as he directs For the remainder of the winter continue to drink plenty of fluids and stay well hydrated Take the doxycycline 100 mg twice daily for 1 week with food and then take one half daily with food after that If problems continue with the face make an appointment with the dermatologist Please call us and let us know if you need appointments to be made with orthopedics for shoulder evaluation for rotator cuff

## 2016-01-19 LAB — HEPATIC FUNCTION PANEL
ALK PHOS: 79 IU/L (ref 39–117)
ALT: 20 IU/L (ref 0–44)
AST: 22 IU/L (ref 0–40)
Albumin: 4.4 g/dL (ref 3.6–4.8)
BILIRUBIN TOTAL: 0.6 mg/dL (ref 0.0–1.2)
BILIRUBIN, DIRECT: 0.17 mg/dL (ref 0.00–0.40)
TOTAL PROTEIN: 6.9 g/dL (ref 6.0–8.5)

## 2016-01-19 LAB — CBC WITH DIFFERENTIAL/PLATELET
BASOS ABS: 0 10*3/uL (ref 0.0–0.2)
BASOS: 1 %
EOS (ABSOLUTE): 0.1 10*3/uL (ref 0.0–0.4)
Eos: 1 %
HEMOGLOBIN: 15.4 g/dL (ref 12.6–17.7)
Hematocrit: 44.4 % (ref 37.5–51.0)
Immature Grans (Abs): 0 10*3/uL (ref 0.0–0.1)
Immature Granulocytes: 0 %
LYMPHS ABS: 1.4 10*3/uL (ref 0.7–3.1)
Lymphs: 17 %
MCH: 30.9 pg (ref 26.6–33.0)
MCHC: 34.7 g/dL (ref 31.5–35.7)
MCV: 89 fL (ref 79–97)
Monocytes Absolute: 0.7 10*3/uL (ref 0.1–0.9)
Monocytes: 8 %
NEUTROS ABS: 6.3 10*3/uL (ref 1.4–7.0)
Neutrophils: 73 %
PLATELETS: 301 10*3/uL (ref 150–379)
RBC: 4.99 x10E6/uL (ref 4.14–5.80)
RDW: 12.7 % (ref 12.3–15.4)
WBC: 8.5 10*3/uL (ref 3.4–10.8)

## 2016-01-19 LAB — VITAMIN D 25 HYDROXY (VIT D DEFICIENCY, FRACTURES): Vit D, 25-Hydroxy: 58.9 ng/mL (ref 30.0–100.0)

## 2016-01-19 LAB — NMR, LIPOPROFILE
CHOLESTEROL: 122 mg/dL (ref 100–199)
HDL Cholesterol by NMR: 52 mg/dL (ref >39)
HDL Particle Number: 30.9 umol/L (ref >=30.5)
LDL Particle Number: 594 nmol/L (ref <1000)
LDL SIZE: 20.1 nm (ref >20.5)
LDL-C: 56 mg/dL (ref 0–99)
LP-IR SCORE: 35 (ref <=45)
SMALL LDL PARTICLE NUMBER: 370 nmol/L (ref <=527)
TRIGLYCERIDES BY NMR: 72 mg/dL (ref 0–149)

## 2016-01-19 LAB — BMP8+EGFR
BUN / CREAT RATIO: 14 (ref 10–22)
BUN: 15 mg/dL (ref 8–27)
CHLORIDE: 95 mmol/L — AB (ref 96–106)
CO2: 22 mmol/L (ref 18–29)
Calcium: 9.4 mg/dL (ref 8.6–10.2)
Creatinine, Ser: 1.1 mg/dL (ref 0.76–1.27)
GFR, EST AFRICAN AMERICAN: 84 mL/min/{1.73_m2} (ref >59)
GFR, EST NON AFRICAN AMERICAN: 73 mL/min/{1.73_m2} (ref >59)
Glucose: 90 mg/dL (ref 65–99)
POTASSIUM: 4.1 mmol/L (ref 3.5–5.2)
Sodium: 136 mmol/L (ref 134–144)

## 2016-01-24 ENCOUNTER — Ambulatory Visit: Payer: PRIVATE HEALTH INSURANCE | Admitting: Family Medicine

## 2016-01-26 ENCOUNTER — Ambulatory Visit: Payer: PRIVATE HEALTH INSURANCE | Admitting: Family Medicine

## 2016-02-17 ENCOUNTER — Ambulatory Visit (AMBULATORY_SURGERY_CENTER): Payer: Self-pay | Admitting: *Deleted

## 2016-02-17 VITALS — Ht 71.0 in | Wt 210.0 lb

## 2016-02-17 DIAGNOSIS — Z1211 Encounter for screening for malignant neoplasm of colon: Secondary | ICD-10-CM

## 2016-02-17 NOTE — Progress Notes (Signed)
Patient denies any allergies to egg or soy products. Patient denies complications with anesthesia/sedation.  Patient denies oxygen use at home and denies diet medications. Emmi instructions for colonoscopy  explained but denied.   

## 2016-03-02 ENCOUNTER — Encounter: Payer: Self-pay | Admitting: Internal Medicine

## 2016-03-02 ENCOUNTER — Encounter: Payer: 59 | Admitting: Internal Medicine

## 2016-03-02 ENCOUNTER — Ambulatory Visit (AMBULATORY_SURGERY_CENTER): Payer: 59 | Admitting: Internal Medicine

## 2016-03-02 VITALS — BP 109/80 | HR 68 | Temp 99.3°F | Resp 14 | Ht 71.0 in | Wt 202.0 lb

## 2016-03-02 DIAGNOSIS — Z1211 Encounter for screening for malignant neoplasm of colon: Secondary | ICD-10-CM | POA: Diagnosis not present

## 2016-03-02 MED ORDER — SODIUM CHLORIDE 0.9 % IV SOLN: 500.0000 mL | INTRAVENOUS | Status: DC

## 2016-03-02 NOTE — Progress Notes (Signed)
To recovery, report to Smith, RN, VSS 

## 2016-03-02 NOTE — Patient Instructions (Addendum)
No polyps or cancer again!  You do have diverticulosis - thickened muscle rings and pouches in the colon wall. Please read the handout about this condition.  I appreciate the opportunity to care for you. Gatha Mayer, MD, FACG YOU HAD AN ENDOSCOPIC PROCEDURE TODAY AT [facility]:   Refer to the procedure report that was given to you for any specific questions about what was found during the examination.  If the procedure report does not answer your questions, please call your gastroenterologist to clarify.  If you requested that your care partner not be given the details of your procedure findings, then the procedure report has been included in a sealed envelope for you to review at your convenience later.  YOU SHOULD EXPECT: Some feelings of bloating in the abdomen. Passage of more gas than usual.  Walking can help get rid of the air that was put into your GI tract during the procedure and reduce the bloating. If you had a lower endoscopy (such as a colonoscopy or flexible sigmoidoscopy) you may notice spotting of blood in your stool or on the toilet paper. If you underwent a bowel prep for your procedure, you may not have a normal bowel movement for a few days.  Please Note:  You might notice some irritation and congestion in your nose or some drainage.  This is from the oxygen used during your procedure.  There is no need for concern and it should clear up in a day or so.  SYMPTOMS TO REPORT IMMEDIATELY:   Following lower endoscopy (colonoscopy or flexible sigmoidoscopy):  Excessive amounts of blood in the stool  Significant tenderness or worsening of abdominal pains  Swelling of the abdomen that is new, acute  Fever of 100F or higher   For urgent or emergent issues, a gastroenterologist can be reached at any hour by calling [phone].   DIET: Your first meal following the procedure should be a small meal and then it is ok to progress to your normal  diet. Heavy or fried foods are harder to digest and may make you feel nauseous or bloated.  Likewise, meals heavy in dairy and vegetables can increase bloating.  Drink plenty of fluids but you should avoid alcoholic beverages for 24 hours.  ACTIVITY:  You should plan to take it easy for the rest of today and you should NOT DRIVE or use heavy machinery until tomorrow (because of the sedation medicines used during the test).    FOLLOW UP: Our staff will call the number listed on your records the next business day following your procedure to check on you and address any questions or concerns that you may have regarding the information given to you following your procedure. If we do not reach you, we will leave a message.  However, if you are feeling well and you are not experiencing any problems, there is no need to return our call.  We will assume that you have returned to your regular daily activities without incident.  If any biopsies were taken you will be contacted by phone or by letter within the next 1-3 weeks.  Please call us at [phone] if you have not heard about the biopsies in 3 weeks.    SIGNATURES/CONFIDENTIALITY: You and/or your care partner have signed paperwork which will be entered into your electronic medical record.  These signatures attest to the fact that that the information above on your After Visit Summary has been reviewed and is  understood.  Full responsibility of the confidentiality of this discharge information lies with you and/or your care-partner.  Normal Colonoscopy Repeat in 10 years Diverticulosis handout given Resume medication and diet

## 2016-03-02 NOTE — Op Note (Signed)
[facility] Patient Name: Norman Briggs Procedure Date: 03/02/2016 1:00 PM MRN: [id number] Endoscopist: Gatha Mayer , MD Age: 60 Referring MD:  Date of Birth: 08-06-1956 Gender: Male Procedure:                Colonoscopy Indications:              Screening for colorectal malignant neoplasm Medicines:                Propofol total dose 200 mg IV, Monitored Anesthesia                            Care Procedure:                Pre-Anesthesia Assessment:                           - Prior to the procedure, a History and Physical                            was performed, and patient medications and                            allergies were reviewed. The patient's tolerance of                            previous anesthesia was also reviewed. The risks                            and benefits of the procedure and the sedation                            options and risks were discussed with the patient.                            All questions were answered, and informed consent                            was obtained. Prior Anticoagulants: The patient has                            taken no previous anticoagulant or antiplatelet                            agents. ASA Grade Assessment: II - A patient with                            mild systemic disease. After reviewing the risks                            and benefits, the patient was deemed in                            satisfactory condition to undergo the procedure.  After obtaining informed consent, the colonoscope                            was passed under direct vision. Throughout the                            procedure, the patient's blood pressure, pulse, and                            oxygen saturations were monitored continuously. The                            Model CF-HQ190L [id number]) scope was introduced                            through the anus and advanced to the the cecum,                  identified by appendiceal orifice and ileocecal                            valve. The colonoscopy was performed without                            difficulty. The patient tolerated the procedure                            well. The quality of the bowel preparation was                            good. The bowel preparation used was Miralax. The                            ileocecal valve, appendiceal orifice, and rectum                            were photographed. Scope In: 1:06:32 PM Scope Out: 1:15:33 PM Scope Withdrawal Time: 0 hours 7 minutes 35 seconds  Total Procedure Duration: 0 hours 9 minutes 1 second  Findings:      The perianal and digital rectal examinations were normal. Pertinent       negatives include normal prostate (size, shape, and consistency).      Many diverticula were found in the left colon. There was no evidence of       diverticular bleeding.      The exam was otherwise without abnormality on direct and retroflexion       views. Complications:            No immediate complications. Estimated Blood Loss:     Estimated blood loss: none. Impression:               - Diverticulosis in the left colon. There was no                            evidence of diverticular bleeding.                           -  The examination was otherwise normal on direct                            and retroflexion views.                           - No specimens collected. Recommendation:           - Patient has a contact number available for                            emergencies. The signs and symptoms of potential                            delayed complications were discussed with the                            patient. Return to normal activities tomorrow.                            Written discharge instructions were provided to the                            patient.                           - Resume previous diet.                           - Continue present  medications.                           - Repeat colonoscopy in 10 years for screening                            purposes. Procedure Code(s):        --- Professional ---                           [id number], Colorectal cancer screening; colonoscopy on                            individual not meeting criteria for high risk CPT copyright 2016 [employer]. All rights reserved. Gatha Mayer, MD Gatha Mayer, MD 03/02/2016 1:24:40 PM Number of Addenda: 0

## 2016-03-05 ENCOUNTER — Telehealth: Payer: Self-pay | Admitting: *Deleted

## 2016-03-05 NOTE — Telephone Encounter (Signed)
  Follow up Call-  Call back number 03/02/2016  Post procedure Call Back phone  # [phone]  Permission to leave phone message Yes     Patient questions:  Do you have a fever, pain , or abdominal swelling? No. Pain Score  0 *  Have you tolerated food without any problems? Yes.    Have you been able to return to your normal activities? Yes.    Do you have any questions about your discharge instructions: Diet   No. Medications  No. Follow up visit  No.  Do you have questions or concerns about your Care? No.  Actions: * If pain score is 4 or above: No action needed, pain <4.

## 2016-04-06 ENCOUNTER — Other Ambulatory Visit: Payer: Self-pay | Admitting: Family Medicine

## 2016-04-30 IMAGING — MR MR SHOULDER*R* W/O CM
5 series · 35 of 40 positions shown · non-contrast
Comparison: None.

CLINICAL DATA: Right shoulder and neck pain. Limited range of
motion.

EXAM:
MRI OF THE RIGHT SHOULDER WITHOUT CONTRAST
TECHNIQUE: Multiplanar, multisequence MR imaging of the shoulder was performed.
No intravenous contrast was administered.

[Series 3: T2 fat-sat · axial · 4.0mm · 0.55mm/px · z∈[-9,+65]mm · 8 of 17 slices shown (1 of 3)]
[im 1/17]
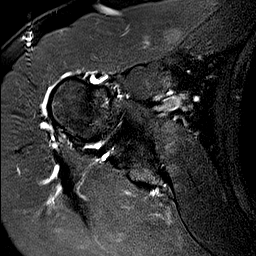
[im 3/17]
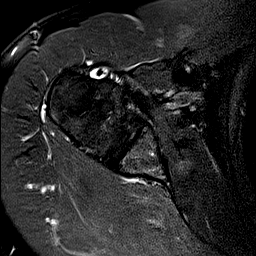
[im 5/17]
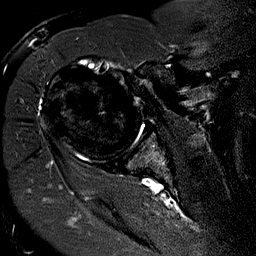
[im 7/17]
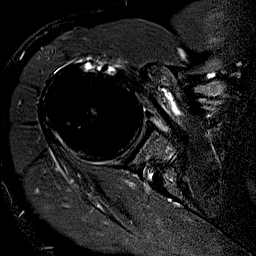
[im 10/17]
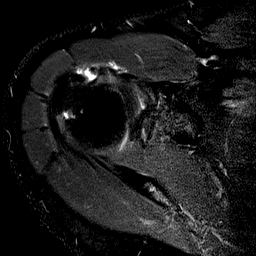
[im 12/17]
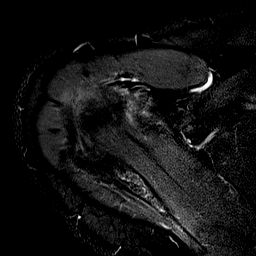
[im 14/17]
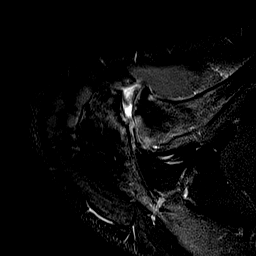
[im 17/17]
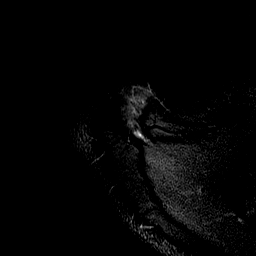

[Series 6: T2 fat-sat · oblique · 4.0mm · 0.55mm/px · 8 of 18 slices shown (2 of 3)]
[im 1/18]
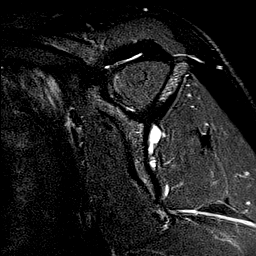
[im 3/18]
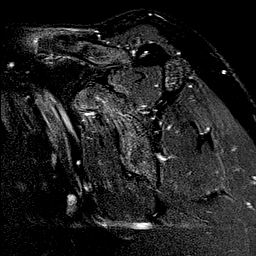
[im 5/18]
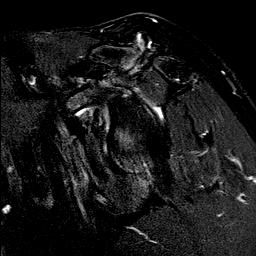
[im 8/18]
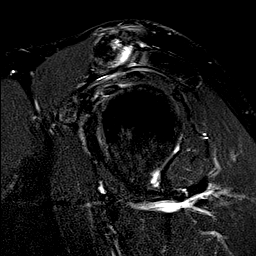
[im 10/18]
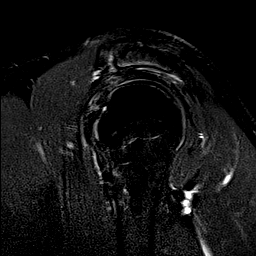
[im 13/18]
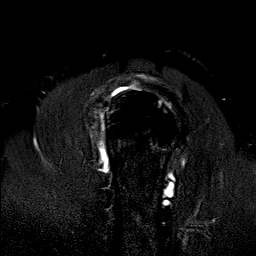
[im 15/18]
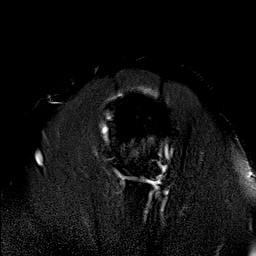
[im 18/18]
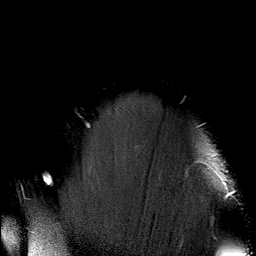

[Series 9: T2 fat-sat · oblique · 4.0mm · 0.27mm/px · 8 of 16 slices shown (3 of 3)]
[im 1/16]
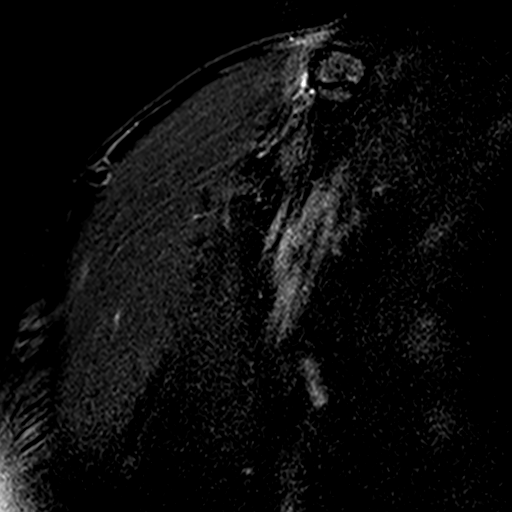
[im 3/16]
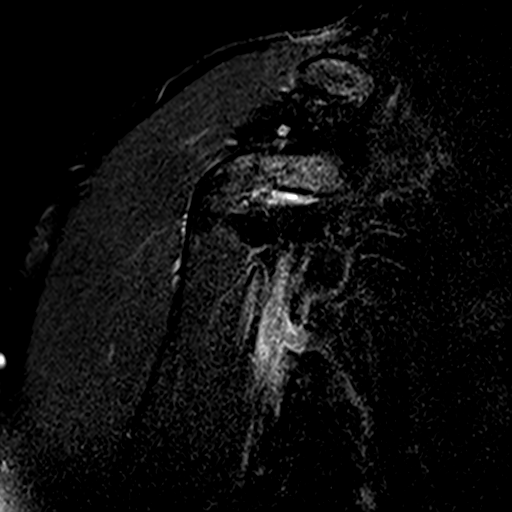
[im 5/16]
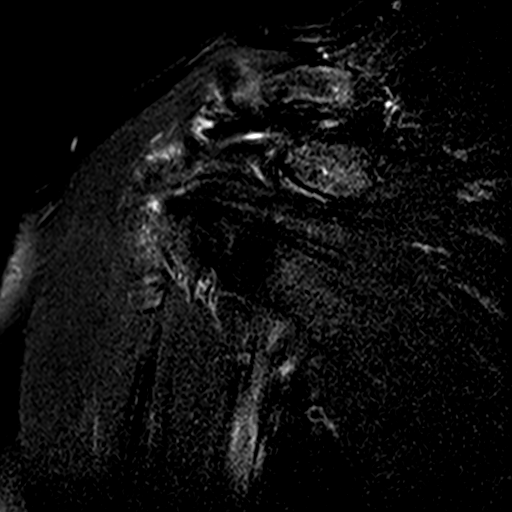
[im 7/16]
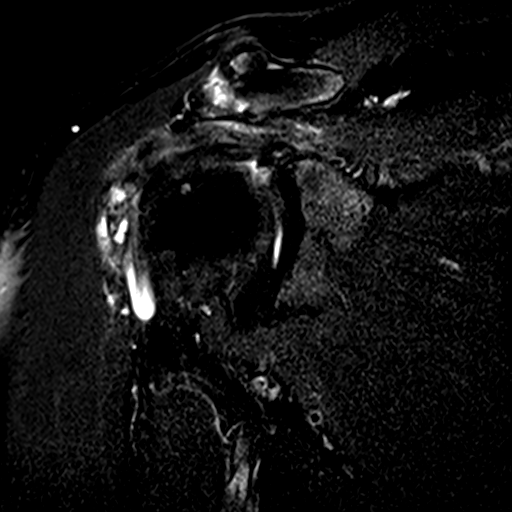
[im 9/16]
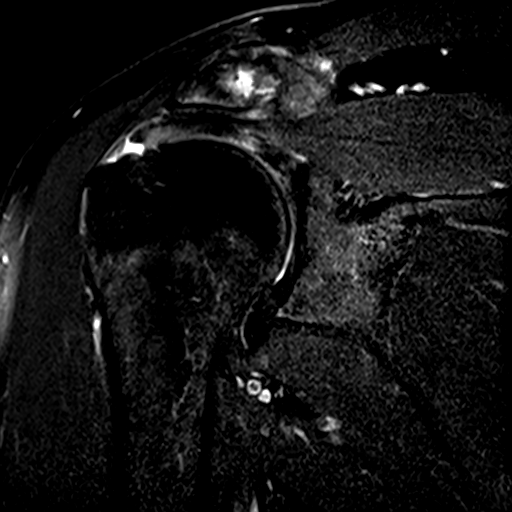
[im 11/16]
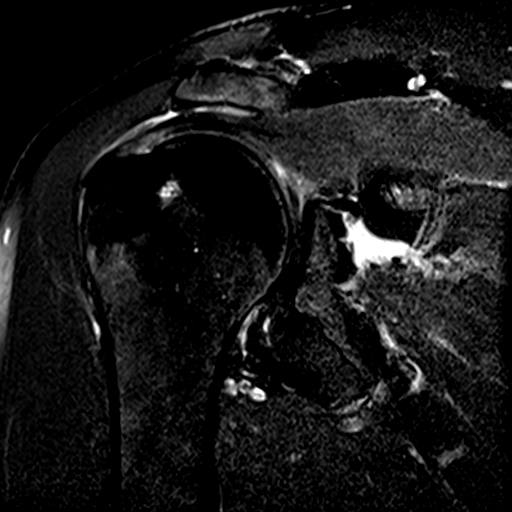
[im 13/16]
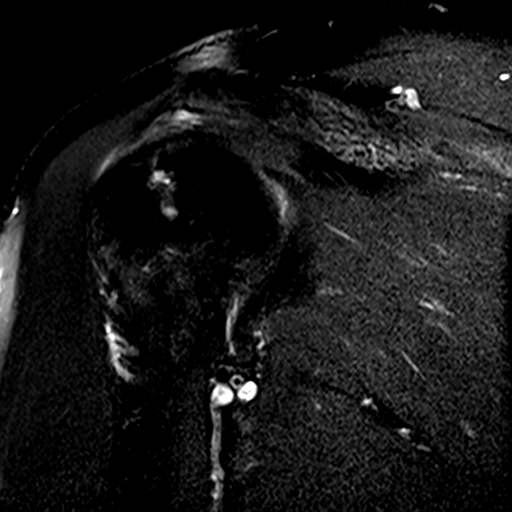
[im 16/16]
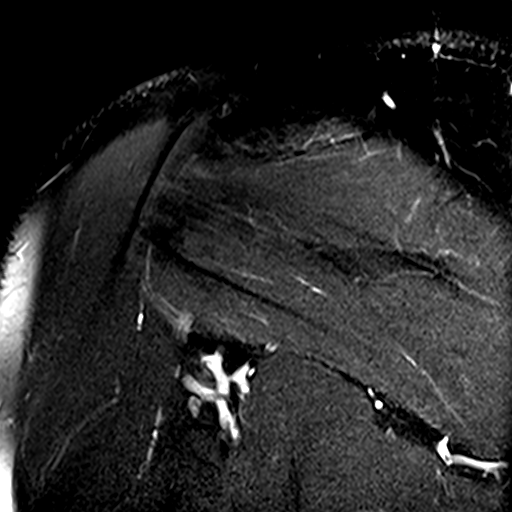

[Series 10: PD · oblique · 4.0mm · 0.55mm/px · 8 of 16 slices shown]
[im 1/16]
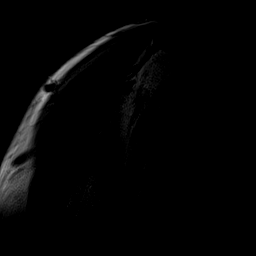
[im 3/16]
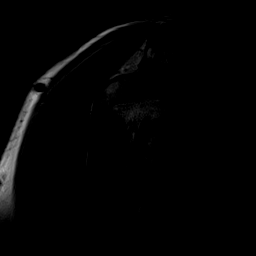
[im 5/16]
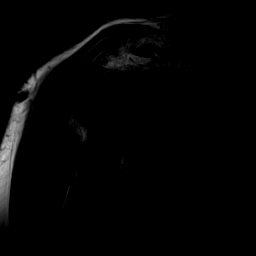
[im 7/16]
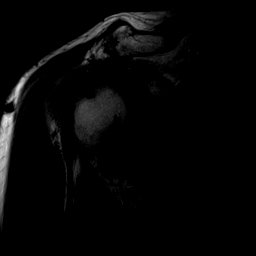
[im 9/16]
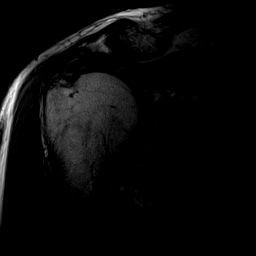
[im 11/16]
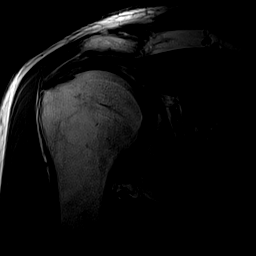
[im 13/16]
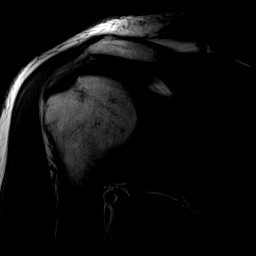
[im 16/16]
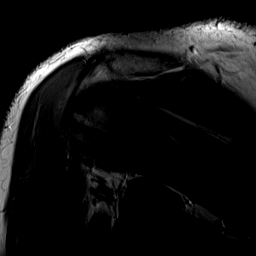

[Series 11: T1 · oblique · 4.0mm · 0.27mm/px · 3 of 18 slices shown]
[im 1/18]
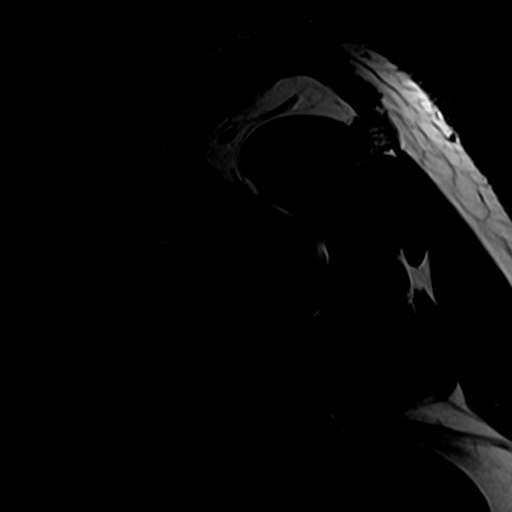
[im 3/18]
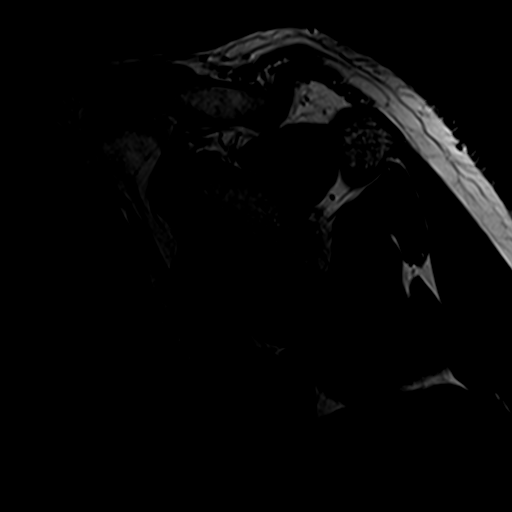
[im 5/18]
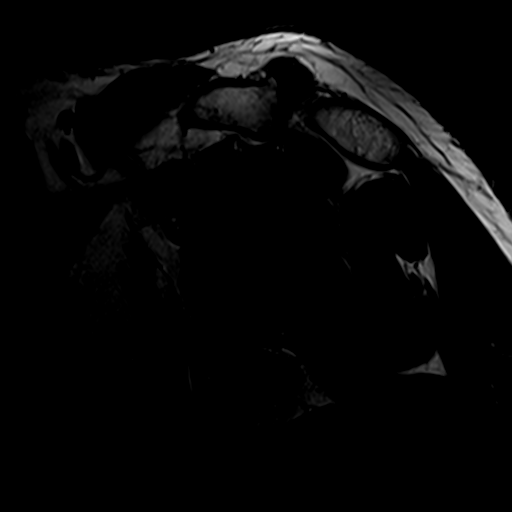

[35 of 40 positions shown; findings below may reference images not displayed]

FINDINGS: Rotator cuff: Moderate tendinosis with a high-grade
partial-thickness, near full-thickness, bursal surface tear of the
anterior supraspinatus tendon measuring approximately 12 mm in AP
dimension. Infraspinatus tendon is intact. Teres minor tendon is
intact. Mild tendinosis of the long head of the subscapularis
tendon.

Muscles: No atrophy or fatty replacement of nor abnormal signal
within, the muscles of the rotator cuff.

Biceps long head: Moderate tendinosis of the intraarticular portion
of the long head of the biceps tendon.

Acromioclavicular Joint: Moderate degenerative changes of the
acromioclavicular joint. Type II acromion.

Glenohumeral Joint: No joint effusion.  No chondral defect.

Labrum: Limited evaluation secondary to lack of intra-articular
fluid. Increased signal in the posterior superior labrum likely
reflecting degeneration.

Bones: No focal marrow signal abnormality. No acute fracture or
dislocation.
IMPRESSION: 1. Moderate tendinosis with a high-grade partial-thickness, near
full-thickness, bursal surface tear of the anterior supraspinatus
tendon measuring approximately 12 mm in AP dimension.
2. Moderate tendinosis of the intraarticular portion of the long
head of the biceps tendon.

## 2016-07-05 ENCOUNTER — Other Ambulatory Visit: Payer: Self-pay | Admitting: Family Medicine

## 2016-07-20 ENCOUNTER — Other Ambulatory Visit: Payer: Self-pay | Admitting: Family Medicine

## 2016-07-25 ENCOUNTER — Other Ambulatory Visit: Payer: Self-pay | Admitting: Family Medicine

## 2016-07-25 ENCOUNTER — Encounter: Payer: Self-pay | Admitting: Family Medicine

## 2016-07-25 ENCOUNTER — Ambulatory Visit (INDEPENDENT_AMBULATORY_CARE_PROVIDER_SITE_OTHER): Payer: PRIVATE HEALTH INSURANCE | Admitting: Family Medicine

## 2016-07-25 VITALS — BP 118/75 | HR 64 | Temp 97.2°F | Ht 71.0 in | Wt 206.0 lb

## 2016-07-25 DIAGNOSIS — N528 Other male erectile dysfunction: Secondary | ICD-10-CM

## 2016-07-25 DIAGNOSIS — E785 Hyperlipidemia, unspecified: Secondary | ICD-10-CM

## 2016-07-25 DIAGNOSIS — Z0000 Encounter for general adult medical examination without abnormal findings: Secondary | ICD-10-CM

## 2016-07-25 DIAGNOSIS — I10 Essential (primary) hypertension: Secondary | ICD-10-CM

## 2016-07-25 DIAGNOSIS — E559 Vitamin D deficiency, unspecified: Secondary | ICD-10-CM

## 2016-07-25 MED ORDER — AZELASTINE HCL 0.1 % NA SOLN: 1.0000 | Freq: Two times a day (BID) | NASAL | 12 refills | Status: DC

## 2016-07-25 NOTE — Patient Instructions (Addendum)
Continue current medications. Continue good therapeutic lifestyle changes which include good diet and exercise. Fall precautions discussed with patient. If an FOBT was given today- please return it to our front desk. If you are over 61 years old - you may need Prevnar 1 or the adult Pneumonia vaccine.  After your visit with [place] today you will receive a survey in the mail or online from [employer] regarding your care with [place]. Please take a moment to fill this out. Your feedback is very important to [place] as you can help [place] better understand your patient needs as well as improve your experience and satisfaction. WE CARE ABOUT YOU!!!   Follow-up with urologist on a yearly basis as planned We will send a copy of the PSA report to him as soon as it becomes available Get your eye exam as planned and make sure you have the ophthalmologist to send [place] a copy of the report Follow-up with orthopedist for shoulder as planned We will plan to do an exercise treadmill test sometime in the spring of 2018. This is because of the family history that is positive for heart disease. The patient should discuss with the urologist the lack of efficacy of the generic Viagra compared to the brand name Viagra.

## 2016-07-25 NOTE — Progress Notes (Signed)
Subjective:    Patient ID: Norman Briggs, male    DOB: 07/17/1956, 60 y.o.   MRN: [id number]  HPI Patient is here today for annual wellness exam and follow up of chronic medical problems which includes hyperlipidemia and hypertension. He is taking medications regularly.The patient is doing well overall and is waiting to have a rotator cuff repair in his right shoulder sometime in October or November. He will actually visit the doctor at that time. He will get lab work today and we will also get a PSA which we will send to his urologist in [facility]. He is walking regularly 2 miles daily and doing this well since his right knee replacement. He denies any chest pain pressure palpitations or tightness in his chest. He denies any shortness of breath. He denies any trouble with his GI tract including nausea vomiting diarrhea blood in the stool or black tarry bowel movements. He is passing his water without problems. He is been using generic Viagra but it does not work as well as the brand name does. He has an upcoming appointment with his urologist later this month and we will make sure that we send a copy of his PSA to him. His name is Dr. Fenton Malling and he is in [facility]. He is also due to get his eye exam in September.     Patient Active Problem List   Diagnosis Date Noted  . Primary osteoarthritis of right knee 07/19/2015  . Metabolic syndrome 91/50/5697  . Hyperlipemia 07/02/2013  . Hypertension 07/02/2013  . Allergic rhinitis 07/02/2013  . History of elevated PSA 07/02/2013  . ED (erectile dysfunction) 07/02/2013  . Vitamin D deficiency 07/02/2013   Outpatient Encounter Prescriptions as of 07/25/2016  Medication Sig  . amLODipine-valsartan (EXFORGE) 5-320 MG tablet TAKE 1 TABLET BY MOUTH EVERY DAY  . aspirin EC 81 MG tablet Take 81 mg by mouth daily.  . Cholecalciferol (VITAMIN D-3) 5000 UNITS TABS Take 1 tablet by mouth daily.  . hydrochlorothiazide (HYDRODIURIL) 25 MG tablet take 1  tablet per day as directed  . Multiple Vitamins-Minerals (EYE-VITE PLUS LUTEIN) CAPS Take 1 capsule by mouth daily.   . Omega-3 Fatty Acids (FISH OIL) 1000 MG CAPS Take 2 capsules by mouth daily.  . rosuvastatin (CRESTOR) 10 MG tablet TAKE 1 TABLET (10 MG TOTAL) BY MOUTH DAILY. GENERIC  . sildenafil (REVATIO) 20 MG tablet TAKE 2-5 TABLETS BY MOUTH ONCE DAILY AS NEEDED FOR SEXUAL ACTIVITY  . [DISCONTINUED] doxycycline (VIBRA-TABS) 100 MG tablet Take 1 twice daily or as directed with food   No facility-administered encounter medications on file as of 07/25/2016.       Review of Systems  Constitutional: Negative.   HENT: Negative.   Eyes: Negative.   Respiratory: Negative.   Cardiovascular: Negative.   Gastrointestinal: Negative.   Endocrine: Negative.   Genitourinary: Negative.   Musculoskeletal: Positive for arthralgias (right shoulder pain).  Skin: Negative.   Allergic/Immunologic: Negative.   Neurological: Negative.   Hematological: Negative.   Psychiatric/Behavioral: Negative.        Objective:   Physical Exam  Constitutional: He is oriented to person, place, and time. He appears well-developed and well-nourished. No distress.  HENT:  Head: Normocephalic and atraumatic.  Right Ear: External ear normal.  Left Ear: External ear normal.  Mouth/Throat: Oropharynx is clear and moist. No oropharyngeal exudate.  Slight nasal congestion and turbinate swelling and pallor bilaterally  Eyes: Conjunctivae and EOM are normal. Pupils are equal, round, and  reactive to light. Right eye exhibits no discharge. Left eye exhibits no discharge. No scleral icterus.  The patient will get his eye exam in September.  Neck: Normal range of motion. Neck supple. No thyromegaly present.  No bruits thyromegaly or anterior cervical adenopathy  Cardiovascular: Normal rate, regular rhythm, normal heart sounds and intact distal pulses.   No murmur heard. Heart is regular at 72/m  Pulmonary/Chest: Effort  normal and breath sounds normal. No respiratory distress. He has no wheezes. He has no rales. He exhibits no tenderness.  Clear anteriorly and posteriorly and no axillary adenopathy  Abdominal: Soft. Bowel sounds are normal. He exhibits no mass. There is no tenderness. There is no rebound and no guarding.  Mild obesity without masses tenderness or organ enlargement. No bruits. No inguinal adenopathy. No suprapubic tenderness.  Genitourinary: Rectum normal, prostate normal and penis normal.  Genitourinary Comments: This is done yearly by Dr. Randel Pigg. This exam is due in the next couple weeks.  Musculoskeletal: Normal range of motion. He exhibits no edema.  Lymphadenopathy:    He has no cervical adenopathy.  Neurological: He is alert and oriented to person, place, and time. He has normal reflexes. No cranial nerve deficit.  Skin: Skin is warm and dry. No rash noted.  Psychiatric: He has a normal mood and affect. His behavior is normal. Judgment and thought content normal.  Nursing note and vitals reviewed.   BP 118/75 (BP Location: Left Arm)   Pulse 64   Temp 97.2 F (36.2 C) (Oral)   Ht '5\' 11"'  (1.803 m)   Wt 206 lb (93.4 kg)   BMI 28.73 kg/m   [facility] reading (PRIMARY) by  Dr.Frisco Cordts-chest x-ray with results pending                                       Assessment & Plan:  1. Vitamin D deficiency -Continue current treatment pending results of lab work - CBC with Differential/Platelet - VITAMIN D 25 Hydroxy (Vit-D Deficiency, Fractures)  2. Annual physical exam -Continue with as aggressive therapeutic lifestyle changes as possible and with Crestor pending results of lab work -We will plan to do an exercise treadmill test sometime next spring -Follow-up with urology and ophthalmology as planned - CBC with Differential/Platelet - PSA, total and free  3. Essential hypertension -The blood pressure is good today and he should continue with current treatment - BMP8+EGFR - CBC  with Differential/Platelet - Hepatic function panel  4. Hyperlipemia -Continue with aggressive therapeutic lifestyle changes which include diet and exercise and current treatment pending results of lab work - CBC with Differential/Platelet - NMR, lipoprofile  5. Erectile dysfunction -Discuss the lack of efficacy with the generic Viagra compared to the brand name Viagra with the urologist   Meds ordered this encounter  Medications  . azelastine (ASTELIN) 0.1 % nasal spray    Sig: Place 1 spray into both nostrils 2 (two) times daily. Use in each nostril as directed    Dispense:  30 mL    Refill:  12   Patient Instructions  Continue current medications. Continue good therapeutic lifestyle changes which include good diet and exercise. Fall precautions discussed with patient. If an FOBT was given today- please return it to our front desk. If you are over 50 years old - you may need Prevnar 33 or the adult Pneumonia vaccine.  After your visit with [place] today  you will receive a survey in the mail or online from [employer] regarding your care with [place]. Please take a moment to fill this out. Your feedback is very important to [place] as you can help [place] better understand your patient needs as well as improve your experience and satisfaction. WE CARE ABOUT YOU!!!   Follow-up with urologist on a yearly basis as planned We will send a copy of the PSA report to him as soon as it becomes available Get your eye exam as planned and make sure you have the ophthalmologist to send [place] a copy of the report Follow-up with orthopedist for shoulder as planned We will plan to do an exercise treadmill test sometime in the spring of 2018. This is because of the family history that is positive for heart disease. The patient should discuss with the urologist the lack of efficacy of the generic Viagra compared to the brand name Viagra.    Arrie Senate MD

## 2016-07-26 LAB — HEPATIC FUNCTION PANEL
ALK PHOS: 77 IU/L (ref 39–117)
ALT: 22 IU/L (ref 0–44)
AST: 24 IU/L (ref 0–40)
Albumin: 4.4 g/dL (ref 3.6–4.8)
Bilirubin Total: 0.6 mg/dL (ref 0.0–1.2)
Bilirubin, Direct: 0.2 mg/dL (ref 0.00–0.40)
Total Protein: 6.9 g/dL (ref 6.0–8.5)

## 2016-07-26 LAB — BMP8+EGFR
BUN / CREAT RATIO: 14 (ref 10–24)
BUN: 16 mg/dL (ref 8–27)
CO2: 24 mmol/L (ref 18–29)
CREATININE: 1.15 mg/dL (ref 0.76–1.27)
Calcium: 9.5 mg/dL (ref 8.6–10.2)
Chloride: 99 mmol/L (ref 96–106)
GFR, EST AFRICAN AMERICAN: 80 mL/min/{1.73_m2} (ref >59)
GFR, EST NON AFRICAN AMERICAN: 69 mL/min/{1.73_m2} (ref >59)
GLUCOSE: 105 mg/dL — AB (ref 65–99)
Potassium: 4.5 mmol/L (ref 3.5–5.2)
SODIUM: 138 mmol/L (ref 134–144)

## 2016-07-26 LAB — CBC WITH DIFFERENTIAL/PLATELET
BASOS: 1 %
Basophils Absolute: 0.1 10*3/uL (ref 0.0–0.2)
EOS (ABSOLUTE): 0.1 10*3/uL (ref 0.0–0.4)
EOS: 2 %
HEMATOCRIT: 47.3 % (ref 37.5–51.0)
Hemoglobin: 15.7 g/dL (ref 12.6–17.7)
Immature Grans (Abs): 0 10*3/uL (ref 0.0–0.1)
Immature Granulocytes: 0 %
LYMPHS ABS: 1.5 10*3/uL (ref 0.7–3.1)
Lymphs: 23 %
MCH: 31.2 pg (ref 26.6–33.0)
MCHC: 33.2 g/dL (ref 31.5–35.7)
MCV: 94 fL (ref 79–97)
MONOS ABS: 0.5 10*3/uL (ref 0.1–0.9)
Monocytes: 8 %
NEUTROS ABS: 4.2 10*3/uL (ref 1.4–7.0)
Neutrophils: 66 %
Platelets: 256 10*3/uL (ref 150–379)
RBC: 5.03 x10E6/uL (ref 4.14–5.80)
RDW: 12.6 % (ref 12.3–15.4)
WBC: 6.3 10*3/uL (ref 3.4–10.8)

## 2016-07-26 LAB — NMR, LIPOPROFILE

## 2016-07-26 LAB — VITAMIN D 25 HYDROXY (VIT D DEFICIENCY, FRACTURES): Vit D, 25-Hydroxy: 62.3 ng/mL (ref 30.0–100.0)

## 2016-07-26 LAB — NO SPECIMEN RECEIVED

## 2016-07-26 LAB — PSA, TOTAL AND FREE
PROSTATE SPECIFIC AG, SERUM: 2.3 ng/mL (ref 0.0–4.0)
PSA FREE: 0.53 ng/mL
PSA, Free Pct: 23 %

## 2016-07-29 LAB — NMR, LIPOPROFILE
CHOLESTEROL: 123 mg/dL (ref 100–199)
HDL CHOLESTEROL BY NMR: 54 mg/dL (ref >39)
HDL PARTICLE NUMBER: 31.1 umol/L (ref >=30.5)
LDL Particle Number: 727 nmol/L (ref <1000)
LDL Size: 20.4 nm (ref >20.5)
LDL-C: 57 mg/dL (ref 0–99)
LP-IR Score: 26 (ref <=45)
Small LDL Particle Number: 371 nmol/L (ref <=527)
Triglycerides by NMR: 60 mg/dL (ref 0–149)

## 2016-07-29 LAB — SPECIMEN STATUS REPORT

## 2016-08-01 ENCOUNTER — Telehealth: Payer: Self-pay | Admitting: Family Medicine

## 2016-08-01 NOTE — Telephone Encounter (Signed)
Pt aware of results 

## 2016-08-23 ENCOUNTER — Other Ambulatory Visit: Payer: Self-pay | Admitting: Family Medicine

## 2016-09-24 ENCOUNTER — Encounter: Payer: Self-pay | Admitting: *Deleted

## 2016-10-15 ENCOUNTER — Ambulatory Visit (INDEPENDENT_AMBULATORY_CARE_PROVIDER_SITE_OTHER): Payer: PRIVATE HEALTH INSURANCE | Admitting: *Deleted

## 2016-10-15 DIAGNOSIS — Z23 Encounter for immunization: Secondary | ICD-10-CM | POA: Diagnosis not present

## 2016-12-21 ENCOUNTER — Other Ambulatory Visit: Payer: Self-pay | Admitting: Family Medicine

## 2016-12-28 ENCOUNTER — Other Ambulatory Visit: Payer: Self-pay | Admitting: Family Medicine

## 2017-01-17 ENCOUNTER — Other Ambulatory Visit: Payer: Self-pay | Admitting: Family Medicine

## 2017-01-23 ENCOUNTER — Encounter: Payer: Self-pay | Admitting: Family Medicine

## 2017-01-23 ENCOUNTER — Ambulatory Visit (INDEPENDENT_AMBULATORY_CARE_PROVIDER_SITE_OTHER): Payer: 59 | Admitting: Family Medicine

## 2017-01-23 VITALS — BP 126/76 | HR 65 | Temp 97.2°F | Ht 71.0 in | Wt 219.0 lb

## 2017-01-23 DIAGNOSIS — E8881 Metabolic syndrome: Secondary | ICD-10-CM | POA: Diagnosis not present

## 2017-01-23 DIAGNOSIS — E7800 Pure hypercholesterolemia, unspecified: Secondary | ICD-10-CM

## 2017-01-23 DIAGNOSIS — I10 Essential (primary) hypertension: Secondary | ICD-10-CM

## 2017-01-23 DIAGNOSIS — Z8249 Family history of ischemic heart disease and other diseases of the circulatory system: Secondary | ICD-10-CM

## 2017-01-23 DIAGNOSIS — E559 Vitamin D deficiency, unspecified: Secondary | ICD-10-CM

## 2017-01-23 DIAGNOSIS — M75101 Unspecified rotator cuff tear or rupture of right shoulder, not specified as traumatic: Secondary | ICD-10-CM

## 2017-01-23 NOTE — Progress Notes (Signed)
Subjective:    Patient ID: Norman Briggs, male    DOB: 21-May-1956, 61 y.o.   MRN: [id number]  HPI Pt here for follow up and management of chronic medical problems which includes hyperlipidemia and hypertension. He is taking medication regularly.Patient continues to have right shoulder pain and neck pain. He is due to return an FOBT and will get lab work today. The patient's previous MRIs were reviewed of his neck and shoulder. He did go see the neurosurgeon and the neurosurgeon said he would not recommend any surgery of any kind until he had much more numbness and tingling in both hands. He does have a rotator cuff tear he would like to go see an orthopedic surgeon in [place], Dr. Berenice Primas. We will do a referral for this. The patient denies any chest pain or shortness of breath. He denies any trouble with heartburn indigestion nausea vomiting diarrhea blood in the stool or black tarry bowel movements. He is passing his water without problems. He does go see his urologist on a yearly basis.     Patient Active Problem List   Diagnosis Date Noted  . Primary osteoarthritis of right knee 07/19/2015  . Metabolic syndrome 61/47/0929  . Hyperlipemia 07/02/2013  . Hypertension 07/02/2013  . Allergic rhinitis 07/02/2013  . History of elevated PSA 07/02/2013  . Erectile dysfunction 07/02/2013  . Vitamin D deficiency 07/02/2013   Outpatient Encounter Prescriptions as of 01/23/2017  Medication Sig  . amLODipine-valsartan (EXFORGE) 5-320 MG tablet TAKE 1 TABLET BY MOUTH EVERY DAY  . aspirin EC 81 MG tablet Take 81 mg by mouth daily.  Marland Kitchen azelastine (ASTELIN) 0.1 % nasal spray Place 1 spray into both nostrils 2 (two) times daily. Use in each nostril as directed  . Cholecalciferol (VITAMIN D-3) 5000 UNITS TABS Take 1 tablet by mouth daily.  . hydrochlorothiazide (HYDRODIURIL) 25 MG tablet TAKE 1 TABLET PER DAY AS DIRECTED  . Multiple Vitamins-Minerals (EYE-VITE PLUS LUTEIN) CAPS Take 1 capsule by mouth  daily.   . Omega-3 Fatty Acids (FISH OIL) 1000 MG CAPS Take 2 capsules by mouth daily.  . rosuvastatin (CRESTOR) 10 MG tablet TAKE 1 TABLET (10 MG TOTAL) BY MOUTH DAILY. GENERIC  . sildenafil (REVATIO) 20 MG tablet TAKE 2-5 TABLETS BY MOUTH ONCE DAILY AS NEEDED FOR SEXUAL ACTIVITY  . [DISCONTINUED] Azelastine HCl 0.15 % SOLN USE 1 SPRAY IN EACH NOSTRIL AT BEDTIME   No facility-administered encounter medications on file as of 01/23/2017.      Review of Systems  Constitutional: Negative.   HENT: Negative.   Eyes: Negative.   Respiratory: Negative.   Cardiovascular: Negative.   Gastrointestinal: Negative.   Endocrine: Negative.   Genitourinary: Negative.   Musculoskeletal: Positive for arthralgias (neck and right shoulder pain).  Skin: Negative.   Allergic/Immunologic: Negative.   Neurological: Negative.   Hematological: Negative.   Psychiatric/Behavioral: Negative.        Objective:   Physical Exam  Constitutional: He is oriented to person, place, and time. He appears well-developed and well-nourished. No distress.  HENT:  Head: Normocephalic and atraumatic.  Right Ear: External ear normal.  Left Ear: External ear normal.  Nose: Nose normal.  Mouth/Throat: Oropharynx is clear and moist. No oropharyngeal exudate.  Eyes: Conjunctivae and EOM are normal. Pupils are equal, round, and reactive to light. Right eye exhibits no discharge. Left eye exhibits no discharge. No scleral icterus.  Neck: Normal range of motion. Neck supple. No thyromegaly present.  No bruits thyromegaly or anterior cervical adenopathy  Cardiovascular: Normal rate, regular rhythm, normal heart sounds and intact distal pulses.   No murmur heard. Heart is regular at 72/m  Pulmonary/Chest: Effort normal and breath sounds normal. No respiratory distress. He has no wheezes. He has no rales. He exhibits no tenderness.  Clear anteriorly and posteriorly no axillary adenopathy  Abdominal: Soft. Bowel sounds are normal.  He exhibits no mass. There is no tenderness. There is no rebound and no guarding.  No abdominal tenderness masses or bruits  Genitourinary:  Genitourinary Comments: This exam is done yearly by his urologist  Musculoskeletal: Normal range of motion. He exhibits tenderness. He exhibits no edema.  The patient has limited range of motion of the right shoulder. He has pain with certain movements.  Lymphadenopathy:    He has no cervical adenopathy.  Neurological: He is alert and oriented to person, place, and time. He has normal reflexes. No cranial nerve deficit.  Skin: Skin is warm and dry. No rash noted.  Psychiatric: He has a normal mood and affect. His behavior is normal. Judgment and thought content normal.  Nursing note and vitals reviewed.  BP 126/76 (BP Location: Left Arm)   Pulse 65   Temp 97.2 F (36.2 C) (Oral)   Ht '5\' 11"'  (1.803 m)   Wt 219 lb (99.3 kg)   BMI 30.54 kg/m         Assessment & Plan:  1. Vitamin D deficiency -Continue current treatment pending results of lab work - CBC with Differential/Platelet - VITAMIN D 25 Hydroxy (Vit-D Deficiency, Fractures)  2. Essential hypertension -The blood pressure is good today and he will continue with current treatment - CBC with Differential/Platelet - BMP8+EGFR - Hepatic function panel  3. Pure hypercholesterolemia -Continue with current treatment and aggressive therapeutic lifestyle changes pending results of lab work - CBC with Differential/Platelet - NMR, lipoprofile  4. Metabolic syndrome -Continue with aggressive therapeutic lifestyle changes through diet and exercise - CBC with Differential/Platelet - BMP8+EGFR  5. Family history of heart disease -Continue with aggressive therapeutic lifestyle changes and current medication - CBC with Differential/Platelet - NMR, lipoprofile  6. Tear of right rotator cuff, unspecified tear extent -Because of the persistent pain and inability to raise the right arm which  is his main arm to use we will do a referral to orthopedic surgery - Ambulatory referral to Orthopedic Surgery  Patient Instructions  Continue current medications. Continue good therapeutic lifestyle changes which include good diet and exercise. Fall precautions discussed with patient. If an FOBT was given today- please return it to our front desk. If you are over 4 years old - you may need Prevnar 54 or the adult Pneumonia vaccine.  **Flu shots are available--- please call and schedule a FLU-CLINIC appointment**  After your visit with [place] today you will receive a survey in the mail or online from [employer] regarding your care with [place]. Please take a moment to fill this out. Your feedback is very important to [place] as you can help [place] better understand your patient needs as well as improve your experience and satisfaction. WE CARE ABOUT YOU!!!   We will work on getting the appointment with orthopedic surgeon regarding your rotator cuff tear Follow-up with neurosurgery as planned Continue to work aggressively on diet with exercise and food restriction This winter drink plenty of fluids and stay well hydrated in practice good respiratory hygiene and hand hygiene.  Arrie Senate MD

## 2017-01-23 NOTE — Patient Instructions (Addendum)
Continue current medications. Continue good therapeutic lifestyle changes which include good diet and exercise. Fall precautions discussed with patient. If an FOBT was given today- please return it to our front desk. If you are over 62 years old - you may need Prevnar 64 or the adult Pneumonia vaccine.  **Flu shots are available--- please call and schedule a FLU-CLINIC appointment**  After your visit with [place] today you will receive a survey in the mail or online from [employer] regarding your care with [place]. Please take a moment to fill this out. Your feedback is very important to [place] as you can help [place] better understand your patient needs as well as improve your experience and satisfaction. WE CARE ABOUT YOU!!!   We will work on getting the appointment with orthopedic surgeon regarding your rotator cuff tear Follow-up with neurosurgery as planned Continue to work aggressively on diet with exercise and food restriction This winter drink plenty of fluids and stay well hydrated in practice good respiratory hygiene and hand hygiene.

## 2017-01-24 ENCOUNTER — Telehealth: Payer: Self-pay | Admitting: Family Medicine

## 2017-01-24 LAB — BMP8+EGFR
BUN/Creatinine Ratio: 14 (ref 10–24)
BUN: 16 mg/dL (ref 8–27)
CALCIUM: 9.4 mg/dL (ref 8.6–10.2)
CHLORIDE: 100 mmol/L (ref 96–106)
CO2: 23 mmol/L (ref 18–29)
Creatinine, Ser: 1.15 mg/dL (ref 0.76–1.27)
GFR calc non Af Amer: 68 mL/min/{1.73_m2} (ref >59)
GFR, EST AFRICAN AMERICAN: 79 mL/min/{1.73_m2} (ref >59)
GLUCOSE: 101 mg/dL — AB (ref 65–99)
POTASSIUM: 4.4 mmol/L (ref 3.5–5.2)
Sodium: 140 mmol/L (ref 134–144)

## 2017-01-24 LAB — CBC WITH DIFFERENTIAL/PLATELET
BASOS ABS: 0.1 10*3/uL (ref 0.0–0.2)
Basos: 1 %
EOS (ABSOLUTE): 0.2 10*3/uL (ref 0.0–0.4)
Eos: 2 %
Hematocrit: 48 % (ref 37.5–51.0)
Hemoglobin: 16.1 g/dL (ref 13.0–17.7)
IMMATURE GRANULOCYTES: 0 %
Immature Grans (Abs): 0 10*3/uL (ref 0.0–0.1)
Lymphocytes Absolute: 1.5 10*3/uL (ref 0.7–3.1)
Lymphs: 21 %
MCH: 30.7 pg (ref 26.6–33.0)
MCHC: 33.5 g/dL (ref 31.5–35.7)
MCV: 91 fL (ref 79–97)
MONOS ABS: 0.8 10*3/uL (ref 0.1–0.9)
Monocytes: 12 %
NEUTROS PCT: 64 %
Neutrophils Absolute: 4.6 10*3/uL (ref 1.4–7.0)
PLATELETS: 271 10*3/uL (ref 150–379)
RBC: 5.25 x10E6/uL (ref 4.14–5.80)
RDW: 12.6 % (ref 12.3–15.4)
WBC: 7.2 10*3/uL (ref 3.4–10.8)

## 2017-01-24 LAB — NMR, LIPOPROFILE
CHOLESTEROL: 131 mg/dL (ref 100–199)
HDL CHOLESTEROL BY NMR: 47 mg/dL (ref >39)
HDL PARTICLE NUMBER: 33.8 umol/L (ref >=30.5)
LDL Particle Number: 913 nmol/L (ref <1000)
LDL Size: 20.6 nm (ref >20.5)
LDL-C: 67 mg/dL (ref 0–99)
LP-IR SCORE: 69 — AB (ref <=45)
SMALL LDL PARTICLE NUMBER: 416 nmol/L (ref <=527)
Triglycerides by NMR: 84 mg/dL (ref 0–149)

## 2017-01-24 LAB — HEPATIC FUNCTION PANEL
ALT: 28 IU/L (ref 0–44)
AST: 25 IU/L (ref 0–40)
Albumin: 4.6 g/dL (ref 3.6–4.8)
Alkaline Phosphatase: 83 IU/L (ref 39–117)
BILIRUBIN TOTAL: 0.6 mg/dL (ref 0.0–1.2)
Bilirubin, Direct: 0.14 mg/dL (ref 0.00–0.40)
TOTAL PROTEIN: 7.3 g/dL (ref 6.0–8.5)

## 2017-01-24 LAB — VITAMIN D 25 HYDROXY (VIT D DEFICIENCY, FRACTURES): Vit D, 25-Hydroxy: 56.6 ng/mL (ref 30.0–100.0)

## 2017-01-24 NOTE — Telephone Encounter (Signed)
Detailed message left for patient with results

## 2017-01-29 ENCOUNTER — Ambulatory Visit: Payer: PRIVATE HEALTH INSURANCE | Admitting: Family Medicine

## 2017-02-13 ENCOUNTER — Other Ambulatory Visit: Payer: 59

## 2017-02-13 DIAGNOSIS — Z1212 Encounter for screening for malignant neoplasm of rectum: Principal | ICD-10-CM

## 2017-02-13 DIAGNOSIS — Z1211 Encounter for screening for malignant neoplasm of colon: Secondary | ICD-10-CM

## 2017-02-15 LAB — FECAL OCCULT BLOOD, IMMUNOCHEMICAL: Fecal Occult Bld: NEGATIVE

## 2017-02-19 ENCOUNTER — Other Ambulatory Visit: Payer: Self-pay | Admitting: Family Medicine

## 2017-02-23 DIAGNOSIS — M5412 Radiculopathy, cervical region: Secondary | ICD-10-CM | POA: Insufficient documentation

## 2017-03-25 DIAGNOSIS — M75111 Incomplete rotator cuff tear or rupture of right shoulder, not specified as traumatic: Secondary | ICD-10-CM | POA: Insufficient documentation

## 2017-05-31 HISTORY — PX: SHOULDER SURGERY: SHX246

## 2017-06-30 ENCOUNTER — Other Ambulatory Visit: Payer: Self-pay | Admitting: Family Medicine

## 2017-07-28 ENCOUNTER — Other Ambulatory Visit: Payer: Self-pay | Admitting: Family Medicine

## 2017-07-31 ENCOUNTER — Ambulatory Visit (INDEPENDENT_AMBULATORY_CARE_PROVIDER_SITE_OTHER): Payer: 59 | Admitting: Family Medicine

## 2017-07-31 ENCOUNTER — Encounter: Payer: Self-pay | Admitting: Family Medicine

## 2017-07-31 VITALS — BP 126/76 | HR 70 | Temp 97.4°F | Ht 71.0 in | Wt 224.0 lb

## 2017-07-31 DIAGNOSIS — E7800 Pure hypercholesterolemia, unspecified: Secondary | ICD-10-CM

## 2017-07-31 DIAGNOSIS — Z6831 Body mass index (BMI) 31.0-31.9, adult: Secondary | ICD-10-CM

## 2017-07-31 DIAGNOSIS — Z8249 Family history of ischemic heart disease and other diseases of the circulatory system: Secondary | ICD-10-CM

## 2017-07-31 DIAGNOSIS — E559 Vitamin D deficiency, unspecified: Secondary | ICD-10-CM

## 2017-07-31 DIAGNOSIS — Z0000 Encounter for general adult medical examination without abnormal findings: Secondary | ICD-10-CM

## 2017-07-31 DIAGNOSIS — I10 Essential (primary) hypertension: Secondary | ICD-10-CM | POA: Diagnosis not present

## 2017-07-31 DIAGNOSIS — E8881 Metabolic syndrome: Secondary | ICD-10-CM

## 2017-07-31 DIAGNOSIS — N5201 Erectile dysfunction due to arterial insufficiency: Secondary | ICD-10-CM

## 2017-07-31 LAB — MICROSCOPIC EXAMINATION
BACTERIA UA: NONE SEEN
Epithelial Cells (non renal): NONE SEEN /hpf (ref 0–10)
RENAL EPITHEL UA: NONE SEEN /HPF
WBC UA: NONE SEEN /HPF (ref 0–?)

## 2017-07-31 LAB — URINALYSIS, COMPLETE
BILIRUBIN UA: NEGATIVE
Glucose, UA: NEGATIVE
Ketones, UA: NEGATIVE
Leukocytes, UA: NEGATIVE
NITRITE UA: NEGATIVE
Protein, UA: NEGATIVE
Specific Gravity, UA: 1.02 (ref 1.005–1.030)
UUROB: 0.2 mg/dL (ref 0.2–1.0)
pH, UA: 5 (ref 5.0–7.5)

## 2017-07-31 MED ORDER — AMLODIPINE BESYLATE-VALSARTAN 5-320 MG PO TABS: 1.0000 | ORAL_TABLET | Freq: Every day | ORAL | 3 refills | Status: DC

## 2017-07-31 MED ORDER — HYDROCHLOROTHIAZIDE 25 MG PO TABS: ORAL_TABLET | ORAL | 11 refills | Status: DC

## 2017-07-31 NOTE — Patient Instructions (Addendum)
Continue current medications. Continue good therapeutic lifestyle changes which include good diet and exercise. Fall precautions discussed with patient. If an FOBT was given today- please return it to our front desk. If you are over 62 years old - you may need Prevnar 59 or the adult Pneumonia vaccine.  **Flu shots are available--- please call and schedule a FLU-CLINIC appointment**  After your visit with [place] today you will receive a survey in the mail or online from [employer] regarding your care with [place]. Please take a moment to fill this out. Your feedback is very important to [place] as you can help [place] better understand your patient needs as well as improve your experience and satisfaction. WE CARE ABOUT YOU!!!   We will call you with your lab work results as soon as they become available Remind the caller to send a copy of your lab work to Dr. Anthoney Harada We will also schedule you for a routine cardiac evaluation because of your risk factors and family history of heart disease Please make every effort to lose the weight you have gained through diet and exercise Drink plenty of water and fluids and stay well hydrated Follow-up with orthopedics surgeon as planned

## 2017-07-31 NOTE — Progress Notes (Signed)
Subjective:    Patient ID: Norman Briggs, male    DOB: 1956/05/28, 61 y.o.   MRN: [id number]  HPI Patient is here today for annual wellness exam and follow up of chronic medical problems which includes hypertension and hyperlipidemia. He is taking medication regularly.This patient is doing well overall. He continues to be followed by Dr. Thomasene Mohair for his prostate. His father died of a heart attack. He will get lab work today and a urinalysis today and in addition get an EKG. Want to make sure that he is had a recent stress test. His last colonoscopy was in March 2017. His vital signs are stable and his weight is up 5 pounds since the last visit. He has hypertension vitamin D deficiency and hyperlipidemia. He also has erectile dysfunction. The patient is status post 2 months ago right shoulder surgery for rotator cuff and a spur. He is still recuperating from this and getting physical therapy. The patient denies any chest pain or shortness of breath. The patient denies any trouble with his stomach including nausea vomiting diarrhea blood in the stool black tarry bowel movements or change in bowel habits. He did have a colonoscopy a year ago in the spring and I told him he did not need to have this repeated for 10 years. There is no family history of colon cancer. His brother and sister are in good health. His mother still living and in good health. He's passing his water without problems and his erectile dysfunction is the same as it has been and he uses generic Viagra for this. He gets his eye exams done once yearly and he is up-to-date on that.     Patient Active Problem List   Diagnosis Date Noted  . Primary osteoarthritis of right knee 07/19/2015  . Metabolic syndrome 03/55/9741  . Hyperlipemia 07/02/2013  . Hypertension 07/02/2013  . Allergic rhinitis 07/02/2013  . History of elevated PSA 07/02/2013  . Erectile dysfunction 07/02/2013  . Vitamin D deficiency 07/02/2013   Outpatient Encounter  Prescriptions as of 07/31/2017  Medication Sig  . amLODipine-valsartan (EXFORGE) 5-320 MG tablet TAKE 1 TABLET BY MOUTH EVERY DAY  . aspirin EC 81 MG tablet Take 81 mg by mouth daily.  Marland Kitchen azelastine (ASTELIN) 0.1 % nasal spray Place 1 spray into both nostrils 2 (two) times daily. Use in each nostril as directed  . Cholecalciferol (VITAMIN D-3) 5000 UNITS TABS Take 1 tablet by mouth daily.  . hydrochlorothiazide (HYDRODIURIL) 25 MG tablet TAKE 1 TABLET PER DAY AS DIRECTED  . Multiple Vitamins-Minerals (EYE-VITE PLUS LUTEIN) CAPS Take 1 capsule by mouth daily.   . Omega-3 Fatty Acids (FISH OIL) 1000 MG CAPS Take 2 capsules by mouth daily.  . rosuvastatin (CRESTOR) 10 MG tablet TAKE 1 TABLET (10 MG TOTAL) BY MOUTH DAILY. GENERIC  . sildenafil (REVATIO) 20 MG tablet TAKE 2-5 TABLETS BY MOUTH ONCE DAILY AS NEEDED FOR SEXUAL ACTIVITY   No facility-administered encounter medications on file as of 07/31/2017.       Review of Systems  Constitutional: Negative.   HENT: Negative.   Eyes: Negative.   Respiratory: Negative.   Cardiovascular: Negative.   Gastrointestinal: Negative.   Endocrine: Negative.   Genitourinary: Negative.   Musculoskeletal: Negative.   Skin: Negative.   Allergic/Immunologic: Negative.   Neurological: Negative.   Hematological: Negative.   Psychiatric/Behavioral: Negative.        Objective:   Physical Exam  Constitutional: He is oriented to person, place, and time. He appears well-developed  and well-nourished. No distress.  The patient is doing well overall and recovering from his rotator cuff on the right side.  HENT:  Head: Normocephalic and atraumatic.  Right Ear: External ear normal.  Left Ear: External ear normal.  Nose: Nose normal.  Mouth/Throat: Oropharynx is clear and moist. No oropharyngeal exudate.  Eyes: Pupils are equal, round, and reactive to light. Conjunctivae and EOM are normal. Right eye exhibits no discharge. Left eye exhibits no discharge. No  scleral icterus.  Neck: Normal range of motion. Neck supple. No thyromegaly present.  No bruits thyromegaly or anterior cervical adenopathy  Cardiovascular: Normal rate, regular rhythm, normal heart sounds and intact distal pulses.   No murmur heard. The heart is regular at 72/m  Pulmonary/Chest: Effort normal and breath sounds normal. No respiratory distress. He has no wheezes. He has no rales. He exhibits no tenderness.  Clear anteriorly and posteriorly and no axillary adenopathy  Abdominal: Soft. Bowel sounds are normal. He exhibits no mass. There is no tenderness. There is no rebound and no guarding.  Abdominal obesity without masses tenderness or organ enlargement or bruits. There is an appendectomy scar in the right lower quadrant.  Genitourinary:  Genitourinary Comments: The patient will be seeing his urologist next week, Dr. Anthoney Harada in [facility].  Musculoskeletal: He exhibits no edema.  Status post right knee replacement Limited but improving movement of right arm and shoulder secondary to rotator cuff surgery in spur removal  Lymphadenopathy:    He has no cervical adenopathy.  Neurological: He is alert and oriented to person, place, and time. He has normal reflexes. No cranial nerve deficit.  Skin: Skin is warm and dry. No rash noted.  Psychiatric: He has a normal mood and affect. His behavior is normal. Judgment and thought content normal.  Nursing note and vitals reviewed.  BP 126/76 (BP Location: Left Arm)   Pulse 70   Temp (!) 97.4 F (36.3 C) (Oral)   Ht 5' 11" (1.803 m)   Wt 224 lb (101.6 kg)   BMI 31.24 kg/m    EKG with results pending==     Assessment & Plan:  1. Annual physical exam -Follow-up with urology as planned -Because of increased risk factors for heart disease and because of the family history with the patient's father dying of an acute heart attack we will arrange for a good cardiac evaluation with the cardiologist. - BMP8+EGFR - CBC with  Differential/Platelet - Lipid panel - PSA, total and free - VITAMIN D 25 Hydroxy (Vit-D Deficiency, Fractures) - Hepatic function panel - Urinalysis, Complete  2. Essential hypertension -The patient should continue with current treatment and make every effort to watch sodium intake and lose weight - BMP8+EGFR - CBC with Differential/Platelet - Hepatic function panel - Electrocardiogram report  3. Vitamin D deficiency -Continue with current treatment pending results of lab work - CBC with Differential/Platelet - VITAMIN D 25 Hydroxy (Vit-D Deficiency, Fractures)  4. Pure hypercholesterolemia -Continue with current treatment and aggressive therapeutic lifestyle changes pending results of lab - CBC with Differential/Platelet - Lipid panel  5. Metabolic syndrome -The patient has been in active recently and has gained 5 pounds of weight because of the shoulder surgery but does plan to get out and start exercising more. - BMP8+EGFR - CBC with Differential/Platelet  6. Family history of heart disease -Father died of a heart attack. The patient is having no chest pain currently. - CBC with Differential/Platelet - Electrocardiogram report  7. Erectile dysfunction due to arterial  insufficiency -Continue with sildenafil  8. BMI 31.0-31.9,adult -All efforts at losing weight through exercise and diet and drinking more water  Meds ordered this encounter  Medications  . amLODipine-valsartan (EXFORGE) 5-320 MG tablet    Sig: Take 1 tablet by mouth daily.    Dispense:  90 tablet    Refill:  3  . hydrochlorothiazide (HYDRODIURIL) 25 MG tablet    Sig: TAKE 1 TABLET PER DAY AS DIRECTED    Dispense:  30 tablet    Refill:  11   Patient Instructions  Continue current medications. Continue good therapeutic lifestyle changes which include good diet and exercise. Fall precautions discussed with patient. If an FOBT was given today- please return it to our front desk. If you are over 58  years old - you may need Prevnar 23 or the adult Pneumonia vaccine.  **Flu shots are available--- please call and schedule a FLU-CLINIC appointment**  After your visit with [place] today you will receive a survey in the mail or online from [employer] regarding your care with [place]. Please take a moment to fill this out. Your feedback is very important to [place] as you can help [place] better understand your patient needs as well as improve your experience and satisfaction. WE CARE ABOUT YOU!!!   We will call you with your lab work results as soon as they become available Remind the caller to send a copy of your lab work to Dr. Anthoney Harada We will also schedule you for a routine cardiac evaluation because of your risk factors and family history of heart disease Please make every effort to lose the weight you have gained through diet and exercise Drink plenty of water and fluids and stay well hydrated Follow-up with orthopedics surgeon as planned    Arrie Senate MD

## 2017-07-31 NOTE — Addendum Note (Signed)
Addended by: Zannie Cove on: 07/31/2017 09:29 AM   Modules accepted: Orders

## 2017-08-01 LAB — CBC WITH DIFFERENTIAL/PLATELET
Basophils Absolute: 0.1 10*3/uL (ref 0.0–0.2)
Basos: 1 %
EOS (ABSOLUTE): 0.2 10*3/uL (ref 0.0–0.4)
EOS: 3 %
HEMATOCRIT: 45.3 % (ref 37.5–51.0)
HEMOGLOBIN: 15 g/dL (ref 13.0–17.7)
IMMATURE GRANULOCYTES: 0 %
Immature Grans (Abs): 0 10*3/uL (ref 0.0–0.1)
LYMPHS: 20 %
Lymphocytes Absolute: 1.3 10*3/uL (ref 0.7–3.1)
MCH: 31 pg (ref 26.6–33.0)
MCHC: 33.1 g/dL (ref 31.5–35.7)
MCV: 94 fL (ref 79–97)
MONOCYTES: 9 %
Monocytes Absolute: 0.6 10*3/uL (ref 0.1–0.9)
NEUTROS PCT: 67 %
Neutrophils Absolute: 4.5 10*3/uL (ref 1.4–7.0)
Platelets: 252 10*3/uL (ref 150–379)
RBC: 4.84 x10E6/uL (ref 4.14–5.80)
RDW: 12.7 % (ref 12.3–15.4)
WBC: 6.7 10*3/uL (ref 3.4–10.8)

## 2017-08-01 LAB — BMP8+EGFR
BUN/Creatinine Ratio: 15 (ref 10–24)
BUN: 15 mg/dL (ref 8–27)
CO2: 24 mmol/L (ref 20–29)
CREATININE: 1.03 mg/dL (ref 0.76–1.27)
Calcium: 9.4 mg/dL (ref 8.6–10.2)
Chloride: 100 mmol/L (ref 96–106)
GFR calc Af Amer: 90 mL/min/{1.73_m2} (ref >59)
GFR calc non Af Amer: 78 mL/min/{1.73_m2} (ref >59)
Glucose: 103 mg/dL — ABNORMAL HIGH (ref 65–99)
Potassium: 4.5 mmol/L (ref 3.5–5.2)
Sodium: 140 mmol/L (ref 134–144)

## 2017-08-01 LAB — HEPATIC FUNCTION PANEL
ALBUMIN: 4.5 g/dL (ref 3.6–4.8)
ALT: 32 IU/L (ref 0–44)
AST: 25 IU/L (ref 0–40)
Alkaline Phosphatase: 82 IU/L (ref 39–117)
Bilirubin Total: 0.4 mg/dL (ref 0.0–1.2)
Bilirubin, Direct: 0.1 mg/dL (ref 0.00–0.40)
Total Protein: 6.9 g/dL (ref 6.0–8.5)

## 2017-08-01 LAB — LIPID PANEL
CHOL/HDL RATIO: 2.6 ratio (ref 0.0–5.0)
CHOLESTEROL TOTAL: 119 mg/dL (ref 100–199)
HDL: 46 mg/dL (ref >39)
LDL CALC: 57 mg/dL (ref 0–99)
TRIGLYCERIDES: 81 mg/dL (ref 0–149)
VLDL Cholesterol Cal: 16 mg/dL (ref 5–40)

## 2017-08-01 LAB — PSA, TOTAL AND FREE
PSA FREE PCT: 23.3 %
PSA FREE: 0.28 ng/mL
Prostate Specific Ag, Serum: 1.2 ng/mL (ref 0.0–4.0)

## 2017-08-01 LAB — VITAMIN D 25 HYDROXY (VIT D DEFICIENCY, FRACTURES): VIT D 25 HYDROXY: 52.1 ng/mL (ref 30.0–100.0)

## 2017-08-08 ENCOUNTER — Telehealth: Payer: Self-pay | Admitting: Family Medicine

## 2017-08-08 NOTE — Telephone Encounter (Signed)
PSA faxed to Dr. Thomasene Mohair [phone]

## 2017-09-04 ENCOUNTER — Ambulatory Visit (INDEPENDENT_AMBULATORY_CARE_PROVIDER_SITE_OTHER): Payer: 59 | Admitting: Cardiovascular Disease

## 2017-09-04 ENCOUNTER — Encounter: Payer: Self-pay | Admitting: Cardiovascular Disease

## 2017-09-04 ENCOUNTER — Ambulatory Visit (HOSPITAL_COMMUNITY)
Admission: RE | Admit: 2017-09-04 | Discharge: 2017-09-04 | Disposition: A | Discharge status: Home / Self Care | Payer: 59 | Source: Ambulatory Visit | Attending: Cardiovascular Disease | Admitting: Cardiovascular Disease

## 2017-09-04 VITALS — BP 122/70 | HR 71 | Ht 71.0 in | Wt 219.4 lb

## 2017-09-04 DIAGNOSIS — E7800 Pure hypercholesterolemia, unspecified: Secondary | ICD-10-CM

## 2017-09-04 DIAGNOSIS — I10 Essential (primary) hypertension: Secondary | ICD-10-CM | POA: Diagnosis not present

## 2017-09-04 DIAGNOSIS — I6523 Occlusion and stenosis of bilateral carotid arteries: Secondary | ICD-10-CM | POA: Diagnosis not present

## 2017-09-04 DIAGNOSIS — Z8249 Family history of ischemic heart disease and other diseases of the circulatory system: Secondary | ICD-10-CM | POA: Diagnosis not present

## 2017-09-04 NOTE — Patient Instructions (Addendum)
Medication Instructions:  Your physician recommends that you continue on your current medications as directed. Please refer to the Current Medication list given to you today.  Labwork: none  Testing/Procedures: CALCIUM SCORE AT [facility] [address] 09/10/17 ARRIVE AT 2:15 FOR 2:30 WILL COST $150 OUT OF POCKEY  Follow-Up: Your physician wants you to follow-up in: 2 YEARS You will receive a reminder letter in the mail two months in advance. If you don't receive a letter, please call our office to schedule the follow-up appointment.

## 2017-09-04 NOTE — Progress Notes (Signed)
Cardiology Office Note    Date:  09/10/2017   ID:  Norman Briggs, DOB 1956-10-29, MRN [id number]  PCP:  Chipper Herb, MD  Cardiologist:   Skeet Latch, MD   No chief complaint on file.    History of Present Illness: Norman Briggs is a 61 y.o. male with mild carotid stenosis, hypertension and hyperlipidemia  who presents for follow up.  He was initially seen 07/2015 for a pre-surgical risk assessment prior to knee surgery.  He had no exertional symptoms and was cleared for surgery.  He presents today to re-establish care.  He has been feeling generally well.  He saw his PCP recently and was told that it was time for him to have a repeat stress test.  His last stress test was 5-6 years ago.  His father had a MI at age 38.  Norman Briggs is active but doesn't participate in a specific exercise program.  He had shoulder surgery 07/2504 without complication.  He gained weight from being inactive after surgery.  He recently started walking and lost 5lb.  He has also been limiting bread intake.  He denies chest pain or shortness of breath with activity.  He denies lower extremity edema, orthopnea or PND.      Past Medical History:  Diagnosis Date  . Allergy   . Arthritis    shoulder-right  . Hyperlipidemia   . Hypertension   . Vitamin D deficiency     Past Surgical History:  Procedure Laterality Date  . APPENDECTOMY  12/17/1974  . COLONOSCOPY  2006 + 02/2016   diverticulosis - Carlean Purl  . KNEE SURGERY Right    x 3 surgeries  . lefgt wrist  Left    reapair- glass injury  . PILONIDAL CYST EXCISION    . SHOULDER SURGERY Right 05/31/2017     Current Outpatient Prescriptions  Medication Sig Dispense Refill  . amLODipine-valsartan (EXFORGE) 5-320 MG tablet Take 1 tablet by mouth daily. 90 tablet 3  . aspirin EC 81 MG tablet Take 81 mg by mouth daily.    Marland Kitchen azelastine (ASTELIN) 0.1 % nasal spray Place 1 spray into both nostrils 2 (two) times daily. Use in each nostril as directed 30 mL  12  . Cholecalciferol (VITAMIN D-3) 5000 UNITS TABS Take 1 tablet by mouth daily.    . hydrochlorothiazide (HYDRODIURIL) 25 MG tablet Take 25 mg by mouth as directed. Take 1/2 tablet by mouth daily    . Multiple Vitamins-Minerals (EYE-VITE PLUS LUTEIN) CAPS Take 1 capsule by mouth daily.     . Omega-3 Fatty Acids (FISH OIL) 1000 MG CAPS Take 2 capsules by mouth daily.    . rosuvastatin (CRESTOR) 10 MG tablet Take 5 mg by mouth as directed. 11/2 tablet by mouth daily     . sildenafil (REVATIO) 20 MG tablet TAKE 2-5 TABLETS BY MOUTH ONCE DAILY AS NEEDED FOR SEXUAL ACTIVITY 50 tablet 4   No current facility-administered medications for this visit.     Allergies:   Lipitor [atorvastatin]; Anaprox [naproxen sodium]; and Lisinopril    Social History:  The patient  reports that he quit smoking about 7 years ago. His smoking use included Cigarettes. He smoked 0.50 packs per day. He has never used smokeless tobacco. He reports that he drinks about 2.4 oz of alcohol per week . He reports that he does not use drugs.   Family History:  The patient's family history includes Heart attack in his father; Hypertension in his mother.  ROS:  Please see the history of present illness.   Otherwise, review of systems are positive for dizziness.   All other systems are reviewed and negative.    PHYSICAL EXAM: VS:  BP 122/70   Pulse 71   Ht 5\' 11"  (1.803 m)   Wt 99.5 kg (219 lb 6.4 oz)   BMI 30.60 kg/m  , BMI Body mass index is 30.6 kg/m. GENERAL:  Well appearing HEENT:  Pupils equal round and reactive, fundi not visualized, oral mucosa unremarkable NECK:  No jugular venous distention, waveform within normal limits, carotid upstroke brisk and symmetric, no bruits, no thyromegaly LYMPHATICS:  No cervical adenopathy LUNGS:  Clear to auscultation bilaterally HEART:  RRR.  PMI not displaced or sustained,S1 and S2 within normal limits, no S3, no S4, no clicks, no rubs, no murmurs ABD:  Flat, positive bowel  sounds normal in frequency in pitch, no bruits, no rebound, no guarding, no midline pulsatile mass, no hepatomegaly, no splenomegaly EXT:  2 plus pulses throughout, no edema, no cyanosis no clubbing SKIN:  No rashes no nodules NEURO:  Cranial nerves II through XII grossly intact, motor grossly intact throughout PSYCH:  Cognitively intact, oriented to person place and time   EKG:  EKG is ordered today. The ekg ordered 08/15/15 demonstrates sinus rhythm.  Rate 71 bpm.  09/04/17: Sinus rhythm.  Rate 71 bpm.   Recent Labs: 07/31/2017: ALT 32; BUN 15; Creatinine, Ser 1.03; Hemoglobin 15.0; Platelets 252; Potassium 4.5; Sodium 140    Lipid Panel    Component Value Date/Time   CHOL 119 07/31/2017 0917   CHOL 116 07/02/2013 0930   TRIG 81 07/31/2017 0917   TRIG 84 01/23/2017 1159   TRIG 71 07/02/2013 0930   HDL 46 07/31/2017 0917   HDL 47 01/23/2017 1159   HDL 45 07/02/2013 0930   CHOLHDL 2.6 07/31/2017 0917   LDLCALC 57 07/31/2017 0917   LDLCALC 98 07/08/2014 0849   LDLCALC 57 07/02/2013 0930      Wt Readings from Last 3 Encounters:  09/04/17 99.5 kg (219 lb 6.4 oz)  07/31/17 101.6 kg (224 lb)  01/23/17 99.3 kg (219 lb)      ASSESSMENT AND PLAN:  # Mild carotid stenosis:  Repeat carotid Dopplers today.  LDL 57 07/2017.    # Family history of CAD: [name] doesn't have any symptoms of ischemia.  Therefore we will not get any stress testing at this time.  However, we will get a coronary calcium score to better understand his plaque burden and risk.  Continue aspirin and rosuvastatin.  He was encouraged to increase his exercise routine.   # Hypertension: Blood pressure well-controlled on amlodipine, valsartan and HCTZ.  # Hyperlipidemia: LDL 57 07/2017.  Continue rosuvastatin.  Current medicines are reviewed at length with the patient today.  The patient does not have concerns regarding medicines.  The following changes have been made:  no change  Labs/ tests ordered today  include:   Orders Placed This Encounter  Procedures  . CT CARDIAC SCORING  . EKG 12-Lead     Disposition:   FU with Zair Borawski C. Oval Linsey, MD, [facility] in 2 years.   This note was written with the assistance of speech recognition software.  Please excuse any transcriptional errors.  Signed, Sharnell Knight C. Oval Linsey, MD, [facility]  09/10/2017 12:53 PM    [place]

## 2017-09-10 ENCOUNTER — Ambulatory Visit (INDEPENDENT_AMBULATORY_CARE_PROVIDER_SITE_OTHER)
Admission: RE | Admit: 2017-09-10 | Discharge: 2017-09-10 | Disposition: A | Discharge status: Home / Self Care | Payer: Self-pay | Source: Ambulatory Visit | Attending: Cardiovascular Disease | Admitting: Cardiovascular Disease

## 2017-09-10 DIAGNOSIS — Z8249 Family history of ischemic heart disease and other diseases of the circulatory system: Secondary | ICD-10-CM

## 2017-09-23 ENCOUNTER — Telehealth: Payer: Self-pay | Admitting: Cardiovascular Disease

## 2017-09-23 DIAGNOSIS — I7789 Other specified disorders of arteries and arterioles: Secondary | ICD-10-CM

## 2017-09-23 DIAGNOSIS — I10 Essential (primary) hypertension: Secondary | ICD-10-CM

## 2017-09-23 NOTE — Telephone Encounter (Signed)
-----   Message from Skeet Latch, MD sent at 09/22/2017  2:24 PM EDT ----- Study shows that the aorta is mildly enlarged and there may be thickening of the aortic valve.  Recommend an echo to better assess.  It does show some calcification of the coronary arteries.  Continue aspirin and rosuvastatin to prevent progression of disease.

## 2017-09-23 NOTE — Telephone Encounter (Signed)
Advised patient and scheduled Echo 10/02/17, patient requested be next week.

## 2017-09-23 NOTE — Telephone Encounter (Signed)
F/u Message  Pt states he is returning RN call .please call back to discuss

## 2017-10-02 ENCOUNTER — Ambulatory Visit (HOSPITAL_COMMUNITY): Payer: 59 | Attending: Cardiovascular Disease

## 2017-10-02 ENCOUNTER — Other Ambulatory Visit: Payer: Self-pay

## 2017-10-02 DIAGNOSIS — I7789 Other specified disorders of arteries and arterioles: Secondary | ICD-10-CM | POA: Insufficient documentation

## 2017-10-02 DIAGNOSIS — I082 Rheumatic disorders of both aortic and tricuspid valves: Secondary | ICD-10-CM | POA: Insufficient documentation

## 2017-10-02 DIAGNOSIS — I10 Essential (primary) hypertension: Secondary | ICD-10-CM

## 2017-10-02 DIAGNOSIS — I119 Hypertensive heart disease without heart failure: Secondary | ICD-10-CM | POA: Insufficient documentation

## 2017-10-02 DIAGNOSIS — E785 Hyperlipidemia, unspecified: Secondary | ICD-10-CM | POA: Insufficient documentation

## 2017-10-25 ENCOUNTER — Other Ambulatory Visit: Payer: Self-pay | Admitting: Family Medicine

## 2017-10-29 ENCOUNTER — Telehealth: Payer: Self-pay | Admitting: *Deleted

## 2017-10-29 DIAGNOSIS — I350 Nonrheumatic aortic (valve) stenosis: Secondary | ICD-10-CM

## 2017-10-29 NOTE — Telephone Encounter (Signed)
Notes recorded by Cristopher Estimable, RN on 10/10/2017 at 4:11 PM EDT pt aware of results

## 2017-10-29 NOTE — Telephone Encounter (Signed)
-----   Message from Skeet Latch, MD sent at 10/03/2017  9:12 AM EDT ----- Echo shows that his heart squeezes normally.  There is calcium on the aortic valve and mild aortic stenosis.  We will repeat the echo in 1 year.

## 2017-11-12 ENCOUNTER — Ambulatory Visit (INDEPENDENT_AMBULATORY_CARE_PROVIDER_SITE_OTHER): Payer: 59 | Admitting: *Deleted

## 2017-11-12 DIAGNOSIS — Z23 Encounter for immunization: Secondary | ICD-10-CM

## 2018-01-25 ENCOUNTER — Other Ambulatory Visit: Payer: Self-pay | Admitting: Family Medicine

## 2018-01-27 NOTE — Telephone Encounter (Signed)
Next Ov 02/05/18

## 2018-02-05 ENCOUNTER — Ambulatory Visit: Payer: 59 | Admitting: Family Medicine

## 2018-02-05 ENCOUNTER — Encounter: Payer: Self-pay | Admitting: Family Medicine

## 2018-02-05 VITALS — BP 111/68 | HR 65 | Temp 97.0°F | Ht 71.0 in | Wt 216.0 lb

## 2018-02-05 DIAGNOSIS — R9389 Abnormal findings on diagnostic imaging of other specified body structures: Secondary | ICD-10-CM

## 2018-02-05 DIAGNOSIS — Z8249 Family history of ischemic heart disease and other diseases of the circulatory system: Secondary | ICD-10-CM

## 2018-02-05 DIAGNOSIS — E7800 Pure hypercholesterolemia, unspecified: Secondary | ICD-10-CM

## 2018-02-05 DIAGNOSIS — E8881 Metabolic syndrome: Secondary | ICD-10-CM

## 2018-02-05 DIAGNOSIS — I10 Essential (primary) hypertension: Secondary | ICD-10-CM

## 2018-02-05 DIAGNOSIS — E559 Vitamin D deficiency, unspecified: Secondary | ICD-10-CM | POA: Diagnosis not present

## 2018-02-05 DIAGNOSIS — I350 Nonrheumatic aortic (valve) stenosis: Secondary | ICD-10-CM

## 2018-02-05 NOTE — Patient Instructions (Addendum)
Continue current medications. Continue good therapeutic lifestyle changes which include good diet and exercise. Fall precautions discussed with patient. If an FOBT was given today- please return it to our front desk. If you are over 63 years old - you may need Prevnar 26 or the adult Pneumonia vaccine.  **Flu shots are available--- please call and schedule a FLU-CLINIC appointment**  After your visit with [place] today you will receive a survey in the mail or online from [employer] regarding your care with [place]. Please take a moment to fill this out. Your feedback is very important to [place] as you can help [place] better understand your patient needs as well as improve your experience and satisfaction. WE CARE ABOUT YOU!!!   We will check with the cardiologist to make sure that a second CT scan is not needed because of the opacity findings in the left upper lobe on the first coronary CT. Continue to follow-up with orthopedist as needed Continue to follow-up with cardiology as recommended Continue to follow-up with urology regularly and yearly Stay active physically

## 2018-02-05 NOTE — Progress Notes (Signed)
Subjective:    Patient ID: Norman Briggs, male    DOB: [birth year], 62 y.o.   MRN: [id number]  HPI Pt here for follow up and management of chronic medical problems which includes hypertension and hyperlipidemia. He is taking medication regularly.  The patient is doing well currently.  He has no specific complaints today.  He is due to return in FOBT and this will be given to him before he leaves the office today.  He is also due to get a chest x-ray and his routine lab work for health maintenance.  His weight is down about 8 pounds from the previous visit.  Since his last visit he continued to receive rehab therapy following rotator cuff surgery in the right shoulder.  He has had cardiac CT scoring with a score of 220.  He has dense aortic valvular calcification.  The results from an echocardiogram showed mild aortic stenosis and this will be repeated in a year.  His ejection fraction was 60-65%.  He also has had carotid Dopplers and this showed 0-39% plaque bilaterally.  The patient denies any chest pain pressure tightness or shortness of breath.  He has recuperated fairly well from his shoulder surgery and rotator cuff surgery and has good range of motion with his shoulder.  He is no longer working for the [employer].  He has no trouble with swallowing heartburn indigestion nausea vomiting diarrhea blood in the stool or black tarry bowel movements.  He is passing his water without problems and sees the urologist yearly in August.  The coronary calcium scoring read by the radiologist indicated there was incompletely imaged patchy opacity in the left upper lobe.  An unenhanced CT of the chest was recommended to completely evaluate this opacity.  The patient is not sure if he ever got a follow-up CT or not and were going to look into that and talk with his cardiologist, Dr. Oval Linsey.  He was given a copy of the original coronary calcium CT scoring report.    Patient Active Problem List   Diagnosis Date  Noted  . Primary osteoarthritis of right knee 07/19/2015  . Metabolic syndrome 73/53/2992  . Hyperlipemia 07/02/2013  . Hypertension 07/02/2013  . Allergic rhinitis 07/02/2013  . History of elevated PSA 07/02/2013  . Erectile dysfunction 07/02/2013  . Vitamin D deficiency 07/02/2013   Outpatient Encounter Medications as of 02/05/2018  Medication Sig  . amLODipine-valsartan (EXFORGE) 5-320 MG tablet Take 1 tablet by mouth daily.  Marland Kitchen aspirin EC 81 MG tablet Take 81 mg by mouth daily.  Marland Kitchen azelastine (ASTELIN) 0.1 % nasal spray Place 1 spray into both nostrils 2 (two) times daily. Use in each nostril as directed (Patient taking differently: Place 1 spray into both nostrils at bedtime. Use in each nostril as directed)  . Cholecalciferol (VITAMIN D-3) 5000 UNITS TABS Take 1 tablet by mouth daily.  . hydrochlorothiazide (HYDRODIURIL) 25 MG tablet Take 25 mg by mouth as directed. Take 1/2 tablet by mouth daily  . Multiple Vitamins-Minerals (EYE-VITE PLUS LUTEIN) CAPS Take 1 capsule by mouth daily.   . Omega-3 Fatty Acids (FISH OIL) 1000 MG CAPS Take 2 capsules by mouth daily.  . rosuvastatin (CRESTOR) 10 MG tablet Take 5 mg by mouth as directed. 11/2 tablet by mouth daily   . sildenafil (REVATIO) 20 MG tablet TAKE 2-5 TABLETS BY MOUTH ONCE DAILY AS NEEDED FOR SEXUAL ACTIVITY  . [DISCONTINUED] rosuvastatin (CRESTOR) 10 MG tablet TAKE 1 TABLET (10 MG TOTAL) BY MOUTH DAILY.  GENERIC   No facility-administered encounter medications on file as of 02/05/2018.       Review of Systems  Constitutional: Negative.   HENT: Negative.   Eyes: Negative.   Respiratory: Negative.   Cardiovascular: Negative.   Gastrointestinal: Negative.   Endocrine: Negative.   Genitourinary: Negative.   Musculoskeletal: Negative.   Skin: Negative.   Allergic/Immunologic: Negative.   Neurological: Negative.   Hematological: Negative.   Psychiatric/Behavioral: Negative.        Objective:   Physical Exam    Constitutional: He is oriented to person, place, and time. He appears well-developed and well-nourished. No distress.  The patient is in good spirits and seems to be doing well overall.  His recent history was reviewed with him.  HENT:  Head: Normocephalic and atraumatic.  Right Ear: External ear normal.  Left Ear: External ear normal.  Nose: Nose normal.  Mouth/Throat: Oropharynx is clear and moist. No oropharyngeal exudate.  Eyes: Conjunctivae and EOM are normal. Pupils are equal, round, and reactive to light. Right eye exhibits no discharge. Left eye exhibits no discharge. No scleral icterus.  Neck: Normal range of motion. Neck supple. No thyromegaly present.  No bruits thyromegaly or anterior cervical adenopathy  Cardiovascular: Normal rate, regular rhythm, normal heart sounds and intact distal pulses.  No murmur heard. The heart is regular at 72/min  Pulmonary/Chest: Effort normal and breath sounds normal. No respiratory distress. He has no wheezes. He has no rales. He exhibits no tenderness.  Lungs are clear anteriorly and posteriorly.  There is no axillary adenopathy.  There is no chest wall masses.  Abdominal: Soft. Bowel sounds are normal. He exhibits no mass. There is tenderness. There is no rebound and no guarding.  Slight generalized abdominal tenderness without liver or spleen enlargement bruits and no inguinal adenopathy.  Genitourinary:  Genitourinary Comments: Followed yearly by Dr. Otis Dials in August.  Musculoskeletal: Normal range of motion. He exhibits no edema or tenderness.  Right knee replacement and status post right shoulder repair for rotator cuff surgery.  Good range of motion right shoulder  Lymphadenopathy:    He has no cervical adenopathy.  Neurological: He is alert and oriented to person, place, and time. He has normal reflexes. No cranial nerve deficit.  Skin: Skin is warm and dry. No rash noted.  Psychiatric: He has a normal mood and affect. His behavior  is normal. Judgment and thought content normal.  Nursing note and vitals reviewed.  BP 111/68 (BP Location: Left Arm)   Pulse 65   Temp (!) 97 F (36.1 C) (Oral)   Ht _0  (1.803 m)   Wt 216 lb (98 kg)   BMI 30.13 kg/m         Assessment & Plan:  1. Essential hypertension -The blood pressure is good today and he will continue with current treatment - BMP8+EGFR - CBC with Differential/Platelet - Hepatic function panel  2. Vitamin D deficiency -Continue with current treatment pending results of lab work - CBC with Differential/Platelet - VITAMIN D 25 Hydroxy (Vit-D Deficiency, Fractures)  3. Pure hypercholesterolemia -The patient will continue with his current treatment which is only 5 mg of Crestor daily.  He is also taking some vinegar.  He is also increased his physical activity.  We will not make any changes until the lab work is returned but because of his coronary calcium scoring we may need to go back up to 10 mg daily once the lab work is returned. - CBC with Differential/Platelet  4. Family history of heart disease -Continue to follow-up with cardiology - Lipid panel - CBC with Differential/Platelet  5. Metabolic syndrome -Continue to work aggressively with therapeutic lifestyle changes including diet and exercise - BMP8+EGFR - CBC with Differential/Platelet  6. Nonrheumatic aortic valve stenosis -Continue to follow-up with cardiology and possible future echocardiograms.  7. Abnormal chest CT -There was an opacity in the left upper lobe from the cardiac CT that was done.  We are checking into the fact as to whether he has had a routine CT to follow-up on this as recommended by the radiologist and we will get back in touch with the patient as soon as we have checked this situation out further.  Patient Instructions  Continue current medications. Continue good therapeutic lifestyle changes which include good diet and exercise. Fall precautions discussed with  patient. If an FOBT was given today- please return it to our front desk. If you are over 31 years old - you may need Prevnar 46 or the adult Pneumonia vaccine.  **Flu shots are available--- please call and schedule a FLU-CLINIC appointment**  After your visit with [place] today you will receive a survey in the mail or online from [employer] regarding your care with [place]. Please take a moment to fill this out. Your feedback is very important to [place] as you can help [place] better understand your patient needs as well as improve your experience and satisfaction. WE CARE ABOUT YOU!!!   We will check with the cardiologist to make sure that a second CT scan is not needed because of the opacity findings in the left upper lobe on the first coronary CT. Continue to follow-up with orthopedist as needed Continue to follow-up with cardiology as recommended Continue to follow-up with urology regularly and yearly Stay active physically  Arrie Senate MD

## 2018-02-06 LAB — CBC WITH DIFFERENTIAL/PLATELET
BASOS: 1 %
Basophils Absolute: 0.1 10*3/uL (ref 0.0–0.2)
EOS (ABSOLUTE): 0.1 10*3/uL (ref 0.0–0.4)
Eos: 2 %
HEMOGLOBIN: 15.5 g/dL (ref 13.0–17.7)
Hematocrit: 45.6 % (ref 37.5–51.0)
IMMATURE GRANS (ABS): 0 10*3/uL (ref 0.0–0.1)
IMMATURE GRANULOCYTES: 0 %
LYMPHS: 18 %
Lymphocytes Absolute: 1.3 10*3/uL (ref 0.7–3.1)
MCH: 31.2 pg (ref 26.6–33.0)
MCHC: 34 g/dL (ref 31.5–35.7)
MCV: 92 fL (ref 79–97)
MONOCYTES: 12 %
MONOS ABS: 0.9 10*3/uL (ref 0.1–0.9)
Neutrophils Absolute: 4.9 10*3/uL (ref 1.4–7.0)
Neutrophils: 67 %
Platelets: 265 10*3/uL (ref 150–379)
RBC: 4.97 x10E6/uL (ref 4.14–5.80)
RDW: 13.1 % (ref 12.3–15.4)
WBC: 7.3 10*3/uL (ref 3.4–10.8)

## 2018-02-06 LAB — BMP8+EGFR
BUN / CREAT RATIO: 13 (ref 10–24)
BUN: 17 mg/dL (ref 8–27)
CHLORIDE: 100 mmol/L (ref 96–106)
CO2: 23 mmol/L (ref 20–29)
CREATININE: 1.26 mg/dL (ref 0.76–1.27)
Calcium: 9.5 mg/dL (ref 8.6–10.2)
GFR calc Af Amer: 70 mL/min/{1.73_m2} (ref >59)
GFR calc non Af Amer: 61 mL/min/{1.73_m2} (ref >59)
GLUCOSE: 90 mg/dL (ref 65–99)
POTASSIUM: 4.3 mmol/L (ref 3.5–5.2)
SODIUM: 140 mmol/L (ref 134–144)

## 2018-02-06 LAB — LIPID PANEL
Chol/HDL Ratio: 2.5 ratio (ref 0.0–5.0)
Cholesterol, Total: 115 mg/dL (ref 100–199)
HDL: 46 mg/dL (ref >39)
LDL CALC: 57 mg/dL (ref 0–99)
TRIGLYCERIDES: 60 mg/dL (ref 0–149)
VLDL Cholesterol Cal: 12 mg/dL (ref 5–40)

## 2018-02-06 LAB — HEPATIC FUNCTION PANEL
ALBUMIN: 4.5 g/dL (ref 3.6–4.8)
ALK PHOS: 89 IU/L (ref 39–117)
ALT: 28 IU/L (ref 0–44)
AST: 30 IU/L (ref 0–40)
BILIRUBIN TOTAL: 0.9 mg/dL (ref 0.0–1.2)
Bilirubin, Direct: 0.26 mg/dL (ref 0.00–0.40)
TOTAL PROTEIN: 7.2 g/dL (ref 6.0–8.5)

## 2018-02-06 LAB — VITAMIN D 25 HYDROXY (VIT D DEFICIENCY, FRACTURES): Vit D, 25-Hydroxy: 50.2 ng/mL (ref 30.0–100.0)

## 2018-02-11 ENCOUNTER — Telehealth: Payer: Self-pay | Admitting: *Deleted

## 2018-02-11 DIAGNOSIS — R918 Other nonspecific abnormal finding of lung field: Secondary | ICD-10-CM

## 2018-02-12 NOTE — Telephone Encounter (Signed)
Left message to call back and discuss with patient

## 2018-02-12 NOTE — Telephone Encounter (Signed)
CT scan order placed per Dr Oval Linsey

## 2018-02-14 ENCOUNTER — Other Ambulatory Visit: Payer: 59

## 2018-02-14 DIAGNOSIS — Z1211 Encounter for screening for malignant neoplasm of colon: Secondary | ICD-10-CM

## 2018-02-14 NOTE — Telephone Encounter (Signed)
Follow up  ° ° °Patient is returning your call. °

## 2018-02-14 NOTE — Telephone Encounter (Signed)
Spoke with patient and scheduled CT of chest w/o contrast as recommended from Calcium Score. Patient scheduled for 02/20/18 at 10:15 Patient aware of date and time

## 2018-02-18 LAB — FECAL OCCULT BLOOD, IMMUNOCHEMICAL: Fecal Occult Bld: NEGATIVE

## 2018-02-20 ENCOUNTER — Ambulatory Visit (INDEPENDENT_AMBULATORY_CARE_PROVIDER_SITE_OTHER)
Admission: RE | Admit: 2018-02-20 | Discharge: 2018-02-20 | Disposition: A | Discharge status: Home / Self Care | Payer: 59 | Source: Ambulatory Visit | Attending: Cardiovascular Disease | Admitting: Cardiovascular Disease

## 2018-02-20 DIAGNOSIS — R918 Other nonspecific abnormal finding of lung field: Secondary | ICD-10-CM | POA: Diagnosis not present

## 2018-02-20 NOTE — Telephone Encounter (Signed)
Advised patient and referral placed in Epic  Patient to call back 3/13 if he has not heard from pulmonary

## 2018-02-20 NOTE — Telephone Encounter (Signed)
-----   Message from Skeet Latch, MD sent at 02/20/2018  4:44 PM EST ----- CT is unchanged from prior.  There are some chronic lung changes.  Recommend repeat CT in 1 year and referral to pulmonology.  Calcifications noted in the coronary arteries and aorta.  Continue aspirin and statin.

## 2018-02-28 NOTE — Telephone Encounter (Signed)
Follow up   Patient calling to inform nurse that he has not heard from pulmonary office to schedule  Appointment. Patient was provided the phone number to  [facility]. Patient is requesting nurse to call him.

## 2018-02-28 NOTE — Telephone Encounter (Signed)
Patient scheduled for 03/27/18 with Dr Lake Bells at the [facility] office. Patient aware of date, time, and location.

## 2018-03-27 ENCOUNTER — Institutional Professional Consult (permissible substitution): Payer: 59 | Admitting: Pulmonary Disease

## 2018-04-03 ENCOUNTER — Ambulatory Visit (INDEPENDENT_AMBULATORY_CARE_PROVIDER_SITE_OTHER): Payer: 59 | Admitting: Pulmonary Disease

## 2018-04-03 ENCOUNTER — Encounter: Payer: Self-pay | Admitting: Pulmonary Disease

## 2018-04-03 DIAGNOSIS — J849 Interstitial pulmonary disease, unspecified: Secondary | ICD-10-CM

## 2018-04-03 NOTE — Patient Instructions (Signed)
Abnormal CT scan of the chest: As I explained to you today I cannot tell you for certain was caused this pattern in your lungs but I suspect it is a scar from an old respiratory infection.  However, if you develop cough, coughing up blood, shortness of breath, or chest tightness which is unexplained you need to come back and see me sooner.  We are going to monitor this process with a lung function test now to make sure that your lungs are healthy and then we will repeat this as well as a CT scan in 1 year.  We will plan on seeing him back in 1 year or sooner if needed

## 2018-04-03 NOTE — Progress Notes (Signed)
Subjective:    Patient ID: Norman Briggs, male    DOB: 1956-06-13, 62 y.o.   MRN: [id number]  Synopsis: Referred in April 2019 for an abnormal CT scan of the chest that was performed as part of a cardiac evaluation.  HPI Chief Complaint  Patient presents with  . Consult    Referred by Dr. Oval Linsey for abn CT chest.    Norman Briggs went to see Dr. Oval Linsey recently because his PCP referred him for a CT chest and an echocardiogram.  The CT was abnormal so he was referred here.  He reports no breathing difficulty or cough.  His father died of a heart attack.  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  He notes a little Roundup exposure occasionally with a hand sprayer.    He used to smoke cigarettes, but very sparingly only when playing golf or drinking.  He stopped doing that in 2011.  He says that he never bought cigarettes.  He walks 2 miles a day and involves climbing hills and he will feel slightly sinded doing that but nothing severe.  He doesn't participate in intense physical activity anymore because of all of his joint problems  Past Medical History:  Diagnosis Date  . Allergy   . Arthritis    shoulder-right  . Hyperlipidemia   . Hypertension   . Vitamin D deficiency      Family History  Problem Relation Age of Onset  . Hypertension Mother   . Heart attack Father   . Colon cancer Neg Hx   . Colon polyps Neg Hx   . Esophageal cancer Neg Hx   . Rectal cancer Neg Hx   . Stomach cancer Neg Hx      Social History   Socioeconomic History  . Marital status: Divorced    Spouse name: Not on file  . Number of children: Not on file  . Years of education: Not on file  . Highest education level: Not  on file  Occupational History  . Not on file  Social Needs  . Financial resource strain: Not on file  . Food insecurity:    Worry: Not on file    Inability: Not on file  . Transportation needs:    Medical: Not on file    Non-medical: Not on file  Tobacco Use  . Smoking status: Former Smoker    Packs/day: 0.50    Types: Cigarettes    Last attempt to quit: 07/02/2010    Years since quitting: 7.7  . Smokeless tobacco: Never Used  . Tobacco comment: "social smoker" per patient.   Substance and Sexual Activity  . Alcohol use: Yes    Alcohol/week: 2.4 oz    Types: 4 Cans of beer per week  . Drug use: No  . Sexual activity: Not on file  Lifestyle  . Physical activity:    Days per week: Not on file    Minutes per session: Not on file  . Stress: Not on file  Relationships  . Social connections:    Talks on phone: Not on file    Gets together: Not on file    Attends religious  service: Not on file    Active member of club or organization: Not on file    Attends meetings of clubs or organizations: Not on file    Relationship status: Not on file  . Intimate partner violence:    Fear of current or ex partner: Not on file    Emotionally abused: Not on file    Physically abused: Not on file    Forced sexual activity: Not on file  Other Topics Concern  . Not on file  Social History Narrative  . Not on file     Allergies  Allergen Reactions  . Lipitor [Atorvastatin]     Joint pain and myalgias  . Anaprox [Naproxen Sodium] Rash  . Lisinopril Cough     Outpatient Medications Prior to Visit  Medication Sig Dispense Refill  . amLODipine-valsartan (EXFORGE) 5-320 MG tablet Take 1 tablet by mouth daily. 90 tablet 3  . aspirin EC 81 MG tablet Take 81 mg by mouth daily.    Marland Kitchen azelastine (ASTELIN) 0.1 % nasal spray Place 1 spray into both nostrils 2 (two) times daily. Use in each nostril as directed (Patient taking differently: Place 1 spray into both nostrils at bedtime. Use in each  nostril as directed) 30 mL 12  . Cholecalciferol (VITAMIN D-3) 5000 UNITS TABS Take 1 tablet by mouth daily.    . hydrochlorothiazide (HYDRODIURIL) 25 MG tablet Take 25 mg by mouth as directed. Take 1/2 tablet by mouth daily    . Multiple Vitamins-Minerals (EYE-VITE PLUS LUTEIN) CAPS Take 1 capsule by mouth daily.     . Omega-3 Fatty Acids (FISH OIL) 1000 MG CAPS Take 2 capsules by mouth daily.    . rosuvastatin (CRESTOR) 10 MG tablet Take 5 mg by mouth as directed. 1/2 tablet by mouth daily    . sildenafil (REVATIO) 20 MG tablet TAKE 2-5 TABLETS BY MOUTH ONCE DAILY AS NEEDED FOR SEXUAL ACTIVITY 50 tablet 4   No facility-administered medications prior to visit.       Review of Systems  Constitutional: Negative for fever and unexpected weight change.  HENT: Negative for congestion, dental problem, ear pain, nosebleeds, postnasal drip, rhinorrhea, sinus pressure, sneezing, sore throat and trouble swallowing.   Eyes: Negative for redness and itching.  Respiratory: Negative for cough, chest tightness, shortness of breath and wheezing.   Cardiovascular: Negative for palpitations and leg swelling.  Gastrointestinal: Negative for nausea and vomiting.  Genitourinary: Negative for dysuria.  Musculoskeletal: Negative for joint swelling.  Skin: Negative for rash.  Neurological: Negative for headaches.  Hematological: Does not bruise/bleed easily.  Psychiatric/Behavioral: Negative for dysphoric mood. The patient is not nervous/anxious.        Objective:   Physical Exam  Vitals:   04/03/18 1411  BP: 140/86  Pulse: 83  SpO2: 98%  Weight: 221 lb (100.2 kg)  Height: 5\' 11"  (1.803 m)   Gen: well appearing, no acute distress HENT: NCAT, OP clear, neck supple without masses Eyes: PERRL, EOMi Lymph: no cervical lymphadenopathy PULM: CTA B CV: RRR, no mgr, no JVD GI: BS+, soft, nontender, no hsm Derm: no rash or skin breakdown MSK: scars from multiple joint surgeries (R knee, L wrist),  normal bulk and tone Neuro: A&Ox4, CN II-XII intact, strength 5/5 in all 4 extremities Psyche: normal mood and affect   CBC    Component Value Date/Time   WBC 7.3 02/05/2018 0952   WBC 7.8 07/19/2015 1206   RBC 4.97 02/05/2018 0952   RBC 5.31 07/19/2015 1206  HGB 15.5 02/05/2018 0952   HCT 45.6 02/05/2018 0952   PLT 265 02/05/2018 0952   MCV 92 02/05/2018 0952   MCH 31.2 02/05/2018 0952   MCH 29.3 07/19/2015 1206   MCHC 34.0 02/05/2018 0952   MCHC 31.6 (A) 07/19/2015 1206   RDW 13.1 02/05/2018 0952   LYMPHSABS 1.3 02/05/2018 0952   EOSABS 0.1 02/05/2018 0952   BASOSABS 0.1 02/05/2018 0952    Chest imaging: March 2019 CT chest images independently reviewed showing nonspecific fibrotic changes, there is some reticulation in the bases in the periphery but there is also upper lobe predominant bronchovascular distributed interstitial change and mild appearing lingular bronchiectasis.      Assessment & Plan:   No diagnosis found.  Discussion: Norman Briggs comes to clinic today for evaluation of an abnormal CT scan of the chest which shows a nonspecific scarring pattern throughout both lungs.  In general the findings are mild and he has no significant respiratory complaints to make this an alarming finding.  I explained to him today that the differential diagnosis of the cause of these opacities is broad but given his lack of symptoms we just need to monitor carefully with close monitoring at this time.  However, should he develop shortness of breath, chest tightness, unexplained cough or hemoptysis he would need to be seen sooner rather than later.  I think the best approach is to get a set of lung function test now and then again in a year as well as a repeat high-resolution CT scan of the chest.  If there is no progression of symptoms or lung function change or radiographic findings during that time than the most likely explanation for the radiographic abnormalities would be that this  is a nonspecific scar from a prior infection.  Plan: Abnormal CT scan of the chest: As I explained to you today I cannot tell you for certain was caused this pattern in your lungs but I suspect it is a scar from an old respiratory infection.  However, if you develop cough, coughing up blood, shortness of breath, or chest tightness which is unexplained you need to come back and see me sooner.  We are going to monitor this process with a lung function test now to make sure that your lungs are healthy and then we will repeat this as well as a CT scan in 1 year.  We will plan on seeing him back in 1 year or sooner if needed    Current Outpatient Medications:  .  amLODipine-valsartan (EXFORGE) 5-320 MG tablet, Take 1 tablet by mouth daily., Disp: 90 tablet, Rfl: 3 .  aspirin EC 81 MG tablet, Take 81 mg by mouth daily., Disp: , Rfl:  .  azelastine (ASTELIN) 0.1 % nasal spray, Place 1 spray into both nostrils 2 (two) times daily. Use in each nostril as directed (Patient taking differently: Place 1 spray into both nostrils at bedtime. Use in each nostril as directed), Disp: 30 mL, Rfl: 12 .  Cholecalciferol (VITAMIN D-3) 5000 UNITS TABS, Take 1 tablet by mouth daily., Disp: , Rfl:  .  hydrochlorothiazide (HYDRODIURIL) 25 MG tablet, Take 25 mg by mouth as directed. Take 1/2 tablet by mouth daily, Disp: , Rfl:  .  Multiple Vitamins-Minerals (EYE-VITE PLUS LUTEIN) CAPS, Take 1 capsule by mouth daily. , Disp: , Rfl:  .  Omega-3 Fatty Acids (FISH OIL) 1000 MG CAPS, Take 2 capsules by mouth daily., Disp: , Rfl:  .  rosuvastatin (CRESTOR) 10 MG tablet,  Take 5 mg by mouth as directed. 1/2 tablet by mouth daily, Disp: , Rfl:  .  sildenafil (REVATIO) 20 MG tablet, TAKE 2-5 TABLETS BY MOUTH ONCE DAILY AS NEEDED FOR SEXUAL ACTIVITY, Disp: 50 tablet, Rfl: 4

## 2018-04-23 ENCOUNTER — Ambulatory Visit (INDEPENDENT_AMBULATORY_CARE_PROVIDER_SITE_OTHER): Payer: 59 | Admitting: Pulmonary Disease

## 2018-04-23 ENCOUNTER — Encounter: Payer: Self-pay | Admitting: Family Medicine

## 2018-04-23 DIAGNOSIS — J849 Interstitial pulmonary disease, unspecified: Secondary | ICD-10-CM

## 2018-04-23 DIAGNOSIS — I700 Atherosclerosis of aorta: Secondary | ICD-10-CM | POA: Insufficient documentation

## 2018-04-23 HISTORY — DX: Atherosclerosis of aorta: I70.0

## 2018-04-23 LAB — PULMONARY FUNCTION TEST
DL/VA % pred: 88 %
DL/VA: 4.11 ml/min/mmHg/L
DLCO UNC % PRED: 105 %
DLCO UNC: 34.75 ml/min/mmHg
FEF 25-75 PRE: 2.51 L/s
FEF 25-75 Post: 2.91 L/sec
FEF2575-%Change-Post: 16 %
FEF2575-%Pred-Post: 100 %
FEF2575-%Pred-Pre: 86 %
FEV1-%Change-Post: 4 %
FEV1-%PRED-POST: 100 %
FEV1-%Pred-Pre: 96 %
FEV1-POST: 3.61 L
FEV1-Pre: 3.46 L
FEV1FVC-%Change-Post: 4 %
FEV1FVC-%PRED-PRE: 96 %
FEV6-%CHANGE-POST: 0 %
FEV6-%PRED-POST: 105 %
FEV6-%Pred-Pre: 104 %
FEV6-PRE: 4.75 L
FEV6-Post: 4.8 L
FEV6FVC-%CHANGE-POST: 0 %
FEV6FVC-%PRED-PRE: 104 %
FEV6FVC-%Pred-Post: 105 %
FVC-%Change-Post: 0 %
FVC-%PRED-POST: 100 %
FVC-%Pred-Pre: 100 %
FVC-Post: 4.8 L
FVC-Pre: 4.8 L
POST FEV1/FVC RATIO: 75 %
Post FEV6/FVC ratio: 100 %
Pre FEV1/FVC ratio: 72 %
Pre FEV6/FVC Ratio: 99 %
RV % pred: 67 %
RV: 1.56 L
TLC % pred: 108 %
TLC: 7.71 L

## 2018-04-23 NOTE — Progress Notes (Signed)
pft completed 04/23/18

## 2018-08-06 ENCOUNTER — Encounter: Payer: Self-pay | Admitting: Family Medicine

## 2018-08-06 ENCOUNTER — Ambulatory Visit (INDEPENDENT_AMBULATORY_CARE_PROVIDER_SITE_OTHER): Payer: 59 | Admitting: Family Medicine

## 2018-08-06 VITALS — BP 109/69 | HR 65 | Temp 97.1°F | Ht 71.0 in | Wt 216.0 lb

## 2018-08-06 DIAGNOSIS — E559 Vitamin D deficiency, unspecified: Secondary | ICD-10-CM

## 2018-08-06 DIAGNOSIS — E7800 Pure hypercholesterolemia, unspecified: Secondary | ICD-10-CM

## 2018-08-06 DIAGNOSIS — Z8249 Family history of ischemic heart disease and other diseases of the circulatory system: Secondary | ICD-10-CM

## 2018-08-06 DIAGNOSIS — Z0000 Encounter for general adult medical examination without abnormal findings: Secondary | ICD-10-CM | POA: Diagnosis not present

## 2018-08-06 DIAGNOSIS — I10 Essential (primary) hypertension: Secondary | ICD-10-CM

## 2018-08-06 LAB — URINALYSIS, COMPLETE
BILIRUBIN UA: NEGATIVE
GLUCOSE, UA: NEGATIVE
KETONES UA: NEGATIVE
LEUKOCYTES UA: NEGATIVE
Nitrite, UA: NEGATIVE
Protein, UA: NEGATIVE
RBC, UA: NEGATIVE
Urobilinogen, Ur: 0.2 mg/dL (ref 0.2–1.0)
pH, UA: 5.5 (ref 5.0–7.5)

## 2018-08-06 LAB — MICROSCOPIC EXAMINATION
Bacteria, UA: NONE SEEN
EPITHELIAL CELLS (NON RENAL): NONE SEEN /HPF (ref 0–10)
RBC, UA: NONE SEEN /hpf (ref 0–2)
Renal Epithel, UA: NONE SEEN /hpf
WBC, UA: NONE SEEN /hpf (ref 0–5)

## 2018-08-06 MED ORDER — HYDROCHLOROTHIAZIDE 25 MG PO TABS: 12.5000 mg | ORAL_TABLET | Freq: Every day | ORAL | 3 refills | Status: DC

## 2018-08-06 MED ORDER — AMLODIPINE BESYLATE-VALSARTAN 5-320 MG PO TABS: 1.0000 | ORAL_TABLET | Freq: Every day | ORAL | 3 refills | Status: DC

## 2018-08-06 MED ORDER — ROSUVASTATIN CALCIUM 10 MG PO TABS: 5.0000 mg | ORAL_TABLET | Freq: Every day | ORAL | 3 refills | Status: DC

## 2018-08-06 NOTE — Patient Instructions (Addendum)
Continue current medications. Continue good therapeutic lifestyle changes which include good diet and exercise. Fall precautions discussed with patient. If an FOBT was given today- please return it to our front desk. If you are over 63 years old - you may need Prevnar 1 or the adult Pneumonia vaccine.  **Flu shots are available--- please call and schedule a FLU-CLINIC appointment**  After your visit with [place] today you will receive a survey in the mail or online from [employer] regarding your care with [place]. Please take a moment to fill this out. Your feedback is very important to [place] as you can help [place] better understand your patient needs as well as improve your experience and satisfaction. WE CARE ABOUT YOU!!!   Follow-up with cardiology pulmonology and urology as planned Be sure and take a copy of this report with you when you go visit the urologist especially. Do not forget to get a repeat high-resolution CT scan next spring and make sure that the cardiologist and pulmonologist get a copy of this. Always use respiratory protection and environments that are not good for breathing like mowing the yard etc. Also, please make appointment with dermatology for yearly checkups. Plenty of fluids and stay well-hydrated

## 2018-08-06 NOTE — Progress Notes (Signed)
Subjective:    Patient ID: Norman Briggs, male    DOB: 03-Dec-1956, 62 y.o.   MRN: [id number]  HPI Patient is here today for annual wellness exam and follow up of chronic medical problems which includes hyperlipidemia and hypertension. He is taking medication regularly.  The patient is here today for a complete physical exam.  He is requesting refills on his amlodipine Crestor HCTZ and Astelin.  He will get lab work today and a PSA today as well as a urinalysis.  His last colonoscopy was in March 2017.  His initial vital signs were stable.  He has aortic atherosclerosis and vitamin D deficiency.  His family history is significant for heart disease in his father who has passed away.  He has had knee and shoulder surgery.  He had a chest CT that was done in March of this year.  This showed calcifications of the aortic valve and multiple tiny calcified and noncalcified pulmonary nodules in lungs bilaterally.  He was recommended to have repeat CT scan in 12 months.  According to the radiologist there is a suggestion that this could represent underlying interstitial lung disease and they recommended a high resolution chest CT in 12 months and possible outpatient referral to pulmonology.  This report will be reviewed with him again in full and he will be given a copy of this report to take with him.  He was scheduled to see Dr. Lake Bells at the [facility] office and hopefully he has done this.  And alert.  He sees Dr. Fenton Malling yearly in [facility] with [facility]rology.  He will make sure that he takes a copy of his lab work with him to that visit.  The patient today denies any chest pain pressure tightness or shortness of breath.  He denies any trouble with swallowing heartburn indigestion nausea vomiting diarrhea or blood in the stool or black tarry bowel movements or change in bowel habits.  He is passing his water well with no complaints.  Several medicines will be refilled today.  He will make sure that he  takes a copy of this blood work with him to his visit to see his urologist in a course his cardiologist will be able to see this on the computer along with his pulmonologist.    Patient Active Problem List   Diagnosis Date Noted  . Interstitial pulmonary disease ([place]) 04/23/2018  . Aortic atherosclerosis ([place]) 04/23/2018  . Primary osteoarthritis of right knee 07/19/2015  . Metabolic syndrome 03/00/9233  . Hyperlipemia 07/02/2013  . Hypertension 07/02/2013  . Allergic rhinitis 07/02/2013  . History of elevated PSA 07/02/2013  . Erectile dysfunction 07/02/2013  . Vitamin D deficiency 07/02/2013   Outpatient Encounter Medications as of 08/06/2018  Medication Sig  . amLODipine-valsartan (EXFORGE) 5-320 MG tablet Take 1 tablet by mouth daily.  Marland Kitchen aspirin EC 81 MG tablet Take 81 mg by mouth daily.  Marland Kitchen azelastine (ASTELIN) 0.1 % nasal spray Place 1 spray into both nostrils 2 (two) times daily. Use in each nostril as directed (Patient taking differently: Place 1 spray into both nostrils at bedtime. Use in each nostril as directed)  . Cholecalciferol (VITAMIN D-3) 5000 UNITS TABS Take 1 tablet by mouth daily.  . hydrochlorothiazide (HYDRODIURIL) 25 MG tablet Take 25 mg by mouth as directed. Take 1/2 tablet by mouth daily  . Multiple Vitamins-Minerals (EYE-VITE PLUS LUTEIN) CAPS Take 1 capsule by mouth daily.   . Omega-3 Fatty Acids (FISH OIL) 1000 MG CAPS  Take 2 capsules by mouth daily.  . rosuvastatin (CRESTOR) 10 MG tablet Take 5 mg by mouth as directed. 1/2 tablet by mouth daily  . sildenafil (REVATIO) 20 MG tablet TAKE 2-5 TABLETS BY MOUTH ONCE DAILY AS NEEDED FOR SEXUAL ACTIVITY   No facility-administered encounter medications on file as of 08/06/2018.      Review of Systems  Constitutional: Negative.   HENT: Negative.   Eyes: Negative.   Respiratory: Negative.   Cardiovascular: Negative.   Gastrointestinal: Negative.   Endocrine: Negative.   Genitourinary: Negative.     Musculoskeletal: Negative.   Skin: Negative.   Allergic/Immunologic: Negative.   Neurological: Negative.   Hematological: Negative.   Psychiatric/Behavioral: Negative.        Objective:   Physical Exam  Constitutional: He is oriented to person, place, and time. He appears well-developed and well-nourished. No distress.  The patient is pleasant and relaxed and his mom still lives here in [place].  He comes to visit her frequently.  HENT:  Head: Normocephalic and atraumatic.  Right Ear: External ear normal.  Left Ear: External ear normal.  Nose: Nose normal.  Mouth/Throat: Oropharynx is clear and moist. No oropharyngeal exudate.  Eyes: Pupils are equal, round, and reactive to light. Conjunctivae and EOM are normal. Right eye exhibits no discharge. Left eye exhibits no discharge. No scleral icterus.  Eye exam scheduled for this fall  Neck: Normal range of motion. Neck supple. No tracheal deviation present. No thyromegaly present.  No bruits thyromegaly or anterior cervical adenopathy  Cardiovascular: Normal rate, regular rhythm, normal heart sounds and intact distal pulses.  No murmur heard. Heart is regular at 72/min  Pulmonary/Chest: Effort normal and breath sounds normal. No respiratory distress. He has no wheezes. He has no rales. He exhibits no tenderness.  No axillary adenopathy with good breath sounds bilaterally and clear anteriorly and posteriorly.  Abdominal: Soft. Bowel sounds are normal. He exhibits no mass. There is no tenderness.  No liver or spleen enlargement.  No epigastric tenderness.  No masses no bruits and no inguinal adenopathy.  Genitourinary:  Genitourinary Comments: The patient sees Dr. Fenton Malling with [facility] urology on a regular basis.  Musculoskeletal: Normal range of motion. He exhibits no edema or tenderness.  Status post right knee replacement and right shoulder surgery.  Lymphadenopathy:    He has no cervical adenopathy.  Neurological: He is  alert and oriented to person, place, and time. He has normal reflexes. No cranial nerve deficit.  Lower extremity reflexes were 1+ and equal bilaterally  Skin: Skin is warm and dry. No rash noted. No erythema.  Patient was encouraged to go visit with the dermatologist on a regular basis and have her to look at an area at the right side of the nose.  Psychiatric: He has a normal mood and affect. His behavior is normal. Judgment and thought content normal.  Nursing note and vitals reviewed.  BP 109/69 (BP Location: Left Arm)   Pulse 65   Temp (!) 97.1 F (36.2 C) (Oral)   Ht _0  (1.803 m)   Wt 216 lb (98 kg)   BMI 30.13 kg/m         Assessment & Plan:  1. Annual physical exam -Patient understands to follow-up with dermatology cardiology pulmonology and urology.  He has relationships with all of the specialist. - BMP8+EGFR - CBC with Differential/Platelet - Lipid panel - VITAMIN D 25 Hydroxy (Vit-D Deficiency, Fractures) - Hepatic function panel - PSA, total and  free - Urinalysis, Complete  2. Vitamin D deficiency -10 you with vitamin D replacement pending results of lab work - CBC with Differential/Platelet - VITAMIN D 25 Hydroxy (Vit-D Deficiency, Fractures)  3. Pure hypercholesterolemia -Continue with current cholesterol treatment and as aggressive therapeutic lifestyle changes as possible pending results of lab work - CBC with Differential/Platelet - Lipid panel  4. Essential hypertension -Is good today and he will continue with his efforts at weight loss and sodium restriction - BMP8+EGFR - CBC with Differential/Platelet - Hepatic function panel  5. Family history of heart disease -Follow-up with cardiology as planned - CBC with Differential/Platelet - Lipid panel  Meds ordered this encounter  Medications  . amLODipine-valsartan (EXFORGE) 5-320 MG tablet    Sig: Take 1 tablet by mouth daily.    Dispense:  90 tablet    Refill:  3  . hydrochlorothiazide  (HYDRODIURIL) 25 MG tablet    Sig: Take 0.5 tablets (12.5 mg total) by mouth daily.    Dispense:  45 tablet    Refill:  3  . rosuvastatin (CRESTOR) 10 MG tablet    Sig: Take 0.5 tablets (5 mg total) by mouth daily.    Dispense:  45 tablet    Refill:  3   Patient Instructions  Continue current medications. Continue good therapeutic lifestyle changes which include good diet and exercise. Fall precautions discussed with patient. If an FOBT was given today- please return it to our front desk. If you are over 45 years old - you may need Prevnar 75 or the adult Pneumonia vaccine.  **Flu shots are available--- please call and schedule a FLU-CLINIC appointment**  After your visit with [place] today you will receive a survey in the mail or online from [employer] regarding your care with [place]. Please take a moment to fill this out. Your feedback is very important to [place] as you can help [place] better understand your patient needs as well as improve your experience and satisfaction. WE CARE ABOUT YOU!!!   Follow-up with cardiology pulmonology and urology as planned Be sure and take a copy of this report with you when you go visit the urologist especially. Do not forget to get a repeat high-resolution CT scan next spring and make sure that the cardiologist and pulmonologist get a copy of this. Always use respiratory protection and environments that are not good for breathing like mowing the yard etc. Also, please make appointment with dermatology for yearly checkups. Plenty of fluids and stay well-hydrated  Arrie Senate MD

## 2018-08-07 LAB — LIPID PANEL
Chol/HDL Ratio: 2.8 ratio (ref 0.0–5.0)
Cholesterol, Total: 137 mg/dL (ref 100–199)
HDL: 49 mg/dL (ref >39)
LDL Calculated: 71 mg/dL (ref 0–99)
Triglycerides: 87 mg/dL (ref 0–149)
VLDL Cholesterol Cal: 17 mg/dL (ref 5–40)

## 2018-08-07 LAB — BMP8+EGFR
BUN / CREAT RATIO: 15 (ref 10–24)
BUN: 19 mg/dL (ref 8–27)
CO2: 25 mmol/L (ref 20–29)
Calcium: 9.7 mg/dL (ref 8.6–10.2)
Chloride: 96 mmol/L (ref 96–106)
Creatinine, Ser: 1.24 mg/dL (ref 0.76–1.27)
GFR calc Af Amer: 72 mL/min/{1.73_m2} (ref >59)
GFR, EST NON AFRICAN AMERICAN: 62 mL/min/{1.73_m2} (ref >59)
Glucose: 105 mg/dL — ABNORMAL HIGH (ref 65–99)
POTASSIUM: 4.8 mmol/L (ref 3.5–5.2)
SODIUM: 136 mmol/L (ref 134–144)

## 2018-08-07 LAB — CBC WITH DIFFERENTIAL/PLATELET
BASOS: 1 %
Basophils Absolute: 0.1 10*3/uL (ref 0.0–0.2)
EOS (ABSOLUTE): 0.1 10*3/uL (ref 0.0–0.4)
Eos: 2 %
HEMATOCRIT: 47.3 % (ref 37.5–51.0)
Hemoglobin: 15.8 g/dL (ref 13.0–17.7)
Immature Grans (Abs): 0 10*3/uL (ref 0.0–0.1)
Immature Granulocytes: 0 %
LYMPHS ABS: 1 10*3/uL (ref 0.7–3.1)
Lymphs: 15 %
MCH: 32.2 pg (ref 26.6–33.0)
MCHC: 33.4 g/dL (ref 31.5–35.7)
MCV: 97 fL (ref 79–97)
Monocytes Absolute: 0.8 10*3/uL (ref 0.1–0.9)
Monocytes: 11 %
NEUTROS ABS: 4.9 10*3/uL (ref 1.4–7.0)
Neutrophils: 71 %
PLATELETS: 280 10*3/uL (ref 150–450)
RBC: 4.9 x10E6/uL (ref 4.14–5.80)
RDW: 12.7 % (ref 12.3–15.4)
WBC: 6.9 10*3/uL (ref 3.4–10.8)

## 2018-08-07 LAB — HEPATIC FUNCTION PANEL
ALT: 24 IU/L (ref 0–44)
AST: 23 IU/L (ref 0–40)
Albumin: 4.5 g/dL (ref 3.6–4.8)
Alkaline Phosphatase: 90 IU/L (ref 39–117)
BILIRUBIN TOTAL: 0.7 mg/dL (ref 0.0–1.2)
BILIRUBIN, DIRECT: 0.17 mg/dL (ref 0.00–0.40)
TOTAL PROTEIN: 7.1 g/dL (ref 6.0–8.5)

## 2018-08-07 LAB — PSA, TOTAL AND FREE
PROSTATE SPECIFIC AG, SERUM: 1.5 ng/mL (ref 0.0–4.0)
PSA, Free Pct: 21.3 %
PSA, Free: 0.32 ng/mL

## 2018-08-07 LAB — VITAMIN D 25 HYDROXY (VIT D DEFICIENCY, FRACTURES): VIT D 25 HYDROXY: 62.1 ng/mL (ref 30.0–100.0)

## 2018-09-20 ENCOUNTER — Other Ambulatory Visit: Payer: Self-pay | Admitting: Family Medicine

## 2018-10-08 ENCOUNTER — Other Ambulatory Visit: Payer: Self-pay

## 2018-10-08 ENCOUNTER — Ambulatory Visit (HOSPITAL_COMMUNITY): Payer: 59 | Attending: Cardiology

## 2018-10-08 DIAGNOSIS — I350 Nonrheumatic aortic (valve) stenosis: Secondary | ICD-10-CM | POA: Insufficient documentation

## 2018-10-13 ENCOUNTER — Telehealth: Payer: Self-pay | Admitting: *Deleted

## 2018-10-13 ENCOUNTER — Encounter: Payer: Self-pay | Admitting: Family Medicine

## 2018-10-13 DIAGNOSIS — I350 Nonrheumatic aortic (valve) stenosis: Secondary | ICD-10-CM

## 2018-10-13 DIAGNOSIS — I7789 Other specified disorders of arteries and arterioles: Secondary | ICD-10-CM

## 2018-10-13 NOTE — Telephone Encounter (Signed)
Advised patient of results, order and recall placed in Epic

## 2018-10-13 NOTE — Telephone Encounter (Signed)
-----   Message from Skeet Latch, MD sent at 10/12/2018  9:52 PM EDT ----- Echo shows that his heart is squeezing well.  The aortic valve is thickened and there is mild aortic stenosis.  The aorta is mildly dilated.  Neither of these will cause symptoms at this stage but need continued monitoring.  Repeat echo in 1 year.

## 2018-10-21 ENCOUNTER — Ambulatory Visit (INDEPENDENT_AMBULATORY_CARE_PROVIDER_SITE_OTHER): Payer: 59

## 2018-10-21 DIAGNOSIS — Z23 Encounter for immunization: Secondary | ICD-10-CM

## 2018-11-12 ENCOUNTER — Other Ambulatory Visit: Payer: Self-pay | Admitting: Family Medicine

## 2019-01-09 ENCOUNTER — Other Ambulatory Visit: Payer: Self-pay | Admitting: *Deleted

## 2019-01-09 MED ORDER — AZELASTINE HCL 0.1 % NA SOLN
1.0000 | Freq: Every day | NASAL | 3 refills | Status: DC
Start: 2019-01-09 — End: 2019-04-03

## 2019-02-06 ENCOUNTER — Ambulatory Visit: Payer: 59 | Admitting: Family Medicine

## 2019-02-06 ENCOUNTER — Encounter: Payer: Self-pay | Admitting: Family Medicine

## 2019-02-06 VITALS — BP 121/69 | HR 73 | Temp 97.4°F | Ht 71.0 in | Wt 221.0 lb

## 2019-02-06 DIAGNOSIS — E559 Vitamin D deficiency, unspecified: Secondary | ICD-10-CM

## 2019-02-06 DIAGNOSIS — I350 Nonrheumatic aortic (valve) stenosis: Secondary | ICD-10-CM

## 2019-02-06 DIAGNOSIS — E7800 Pure hypercholesterolemia, unspecified: Secondary | ICD-10-CM | POA: Diagnosis not present

## 2019-02-06 DIAGNOSIS — I10 Essential (primary) hypertension: Secondary | ICD-10-CM | POA: Diagnosis not present

## 2019-02-06 DIAGNOSIS — R918 Other nonspecific abnormal finding of lung field: Secondary | ICD-10-CM

## 2019-02-06 DIAGNOSIS — Z8249 Family history of ischemic heart disease and other diseases of the circulatory system: Secondary | ICD-10-CM

## 2019-02-06 DIAGNOSIS — R9389 Abnormal findings on diagnostic imaging of other specified body structures: Secondary | ICD-10-CM

## 2019-02-06 DIAGNOSIS — E8881 Metabolic syndrome: Secondary | ICD-10-CM

## 2019-02-06 DIAGNOSIS — L723 Sebaceous cyst: Secondary | ICD-10-CM

## 2019-02-06 NOTE — Addendum Note (Signed)
Addended by: Zannie Cove on: 02/06/2019 12:52 PM   Modules accepted: Orders

## 2019-02-06 NOTE — Progress Notes (Signed)
Subjective:    Patient ID: Norman Briggs, male    DOB: 07/22/1956, 63 y.o.   MRN: [id number]  HPI Pt here for follow up and management of chronic medical problems which includes hypertension and hyperlipidemia. He is taking medication regularly.  Patient today complains of some recent fractures of his toes.  He has a cyst on his left thigh.  He is due to get a chest x-ray and lab work.  He has seen the urologist and the eye doctor the dentist.  He is followed by the orthopedist for the fractured toes.  His vital signs are stable except the weight is up by 5 pounds.  I saw him.  He has had a chest CT in the spring 2019 and it was recommended that he have this repeated again in 1 year.  He seen the orthopedist several times because of the fracture in the toes of the.  He is also seen the pulmonologist and the cardiologist.  He was told when he visited the cardiologist that he needed to come back in a year.  Today he denies any chest pain or shortness of breath anymore than usual he denies any trouble with his stomach other than maybe some occasional heartburn at night after eating a big meal at dinner.  He is passing his water well.    Patient Active Problem List   Diagnosis Date Noted  . Aortic stenosis 10/13/2018  . Interstitial pulmonary disease ([place]) 04/23/2018  . Aortic atherosclerosis ([place]) 04/23/2018  . Primary osteoarthritis of right knee 07/19/2015  . Metabolic syndrome 40/98/1191  . Hyperlipemia 07/02/2013  . Hypertension 07/02/2013  . Allergic rhinitis 07/02/2013  . History of elevated PSA 07/02/2013  . Erectile dysfunction 07/02/2013  . Vitamin D deficiency 07/02/2013   Outpatient Encounter Medications as of 02/06/2019  Medication Sig  . amLODipine-valsartan (EXFORGE) 5-320 MG tablet Take 1 tablet by mouth daily.  Marland Kitchen aspirin EC 81 MG tablet Take 81 mg by mouth daily.  Marland Kitchen azelastine (ASTELIN) 0.1 % nasal spray Place 1 spray into both nostrils at bedtime. Use in each nostril as  directed  . Cholecalciferol (VITAMIN D-3) 5000 UNITS TABS Take 1 tablet by mouth daily.  . hydrochlorothiazide (HYDRODIURIL) 25 MG tablet TAKE 1 TABLET PER DAY AS DIRECTED  . Multiple Vitamins-Minerals (EYE-VITE PLUS LUTEIN) CAPS Take 1 capsule by mouth daily.   . Omega-3 Fatty Acids (FISH OIL) 1000 MG CAPS Take 2 capsules by mouth daily.  . rosuvastatin (CRESTOR) 10 MG tablet TAKE 1 TABLET (10 MG TOTAL) BY MOUTH DAILY. GENERIC  . sildenafil (REVATIO) 20 MG tablet TAKE 2-5 TABLETS BY MOUTH ONCE DAILY AS NEEDED FOR SEXUAL ACTIVITY   No facility-administered encounter medications on file as of 02/06/2019.      Review of Systems  Constitutional: Negative.   HENT: Negative.   Eyes: Negative.   Respiratory: Negative.   Cardiovascular: Negative.   Gastrointestinal: Negative.   Endocrine: Negative.   Genitourinary: Negative.   Musculoskeletal: Negative.        Recent broken toes   Skin: Negative.        Cyst area on left thigh  Allergic/Immunologic: Negative.   Neurological: Negative.   Hematological: Negative.   Psychiatric/Behavioral: Negative.        Objective:   Physical Exam Vitals signs and nursing note reviewed.  Constitutional:      General: He is not in acute distress.    Appearance: Normal appearance. He is well-developed. He is obese. He is not ill-appearing.  HENT:     Head: Normocephalic and atraumatic.     Right Ear: Tympanic membrane, ear canal and external ear normal. There is no impacted cerumen.     Left Ear: Tympanic membrane, ear canal and external ear normal. There is no impacted cerumen.     Nose: Nose normal. No congestion.     Mouth/Throat:     Mouth: Mucous membranes are moist.     Pharynx: Oropharynx is clear. No oropharyngeal exudate.  Eyes:     General: No scleral icterus.       Right eye: No discharge.        Left eye: No discharge.     Extraocular Movements: Extraocular movements intact.     Conjunctiva/sclera: Conjunctivae normal.     Pupils:  Pupils are equal, round, and reactive to light.  Neck:     Musculoskeletal: Normal range of motion and neck supple.     Thyroid: No thyromegaly.     Vascular: No carotid bruit.     Trachea: No tracheal deviation.     Comments: No bruits thyromegaly or anterior cervical adenopathy Cardiovascular:     Rate and Rhythm: Normal rate and regular rhythm.     Pulses: Normal pulses.     Heart sounds: Normal heart sounds. No murmur.     Comments: The heart is regular at 72/min with good pedal pulses and no edema Pulmonary:     Effort: Pulmonary effort is normal. No respiratory distress.     Breath sounds: Normal breath sounds. No wheezing or rales.     Comments: Clear anteriorly and posteriorly Chest:     Chest wall: No tenderness.  Abdominal:     General: Bowel sounds are normal.     Palpations: Abdomen is soft. There is no mass.     Tenderness: There is no abdominal tenderness.     Comments: No liver or spleen enlargement.  No bruits no masses and good inguinal pulses.  Genitourinary:    Comments: Patient sees urologist regularly Musculoskeletal: Normal range of motion.        General: Swelling, tenderness, deformity and signs of injury present.     Right lower leg: No edema.     Left lower leg: No edema.     Comments: Slight tenderness in the toes of the foot that had the recent fracture.  This is getting better.  Lymphadenopathy:     Cervical: No cervical adenopathy.  Skin:    General: Skin is warm and dry.     Findings: Lesion present. No rash.     Comments: Cyst of upper left medial thigh.  Neurological:     General: No focal deficit present.     Mental Status: He is alert and oriented to person, place, and time. Mental status is at baseline.     Cranial Nerves: No cranial nerve deficit.     Sensory: No sensory deficit.     Gait: Gait normal.     Deep Tendon Reflexes: Reflexes are normal and symmetric. Reflexes normal.  Psychiatric:        Mood and Affect: Mood normal.         Behavior: Behavior normal.        Thought Content: Thought content normal.        Judgment: Judgment normal.     Comments: Mood affect and behavior are all normal for this patient    BP 121/69 (BP Location: Left Arm)   Pulse 73   Temp (!) 97.4  F (36.3 C) (Oral)   Ht '5\' 11"'  (1.803 m)   Wt 221 lb (100.2 kg)   BMI 30.82 kg/m         Assessment & Plan:  1. Vitamin D deficiency -Continue with vitamin D replacement pending results of lab work - CBC with Differential/Platelet - VITAMIN D 25 Hydroxy (Vit-D Deficiency, Fractures)  2. Pure hypercholesterolemia -Continue with cholesterol treatment and aggressive therapeutic lifestyle changes pending results of lab work - CBC with Differential/Platelet - Lipid panel  3. Essential hypertension -The blood pressure is good today and he will continue with current treatment and watch sodium intake - BMP8+EGFR - CBC with Differential/Platelet - Hepatic function panel  4. Family history of heart disease -Follow-up with cardiology as planned - CBC with Differential/Platelet  5. Metabolic syndrome -Continue to work aggressively on weight with diet and exercise - CBC with Differential/Platelet  6. Pulmonary nodules -Follow-up with pulmonology as planned  7. Nonrheumatic aortic valve stenosis -Follow-up with cardiology as planned  8. Abnormal chest CT -Repeat CT scan in March as recommended by radiology  9. Sebaceous cyst -Patient will schedule visit with dermatology to have this removed from his inner thigh.  Patient Instructions  Continue current medications. Continue good therapeutic lifestyle changes which include good diet and exercise. Fall precautions discussed with patient. If an FOBT was given today- please return it to our front desk. If you are over 54 years old - you may need Prevnar 5 or the adult Pneumonia vaccine.  **Flu shots are available--- please call and schedule a FLU-CLINIC appointment**  After your  visit with [place] today you will receive a survey in the mail or online from [employer] regarding your care with [place]. Please take a moment to fill this out. Your feedback is very important to [place] as you can help [place] better understand your patient needs as well as improve your experience and satisfaction. WE CARE ABOUT YOU!!!   Please call back with information regarding your last x-ray of your chest so that we can compare it to the one that was done in March of 19th to see if you need additional scans or not Follow-up with pulmonology and cardiology as recommended by the specialists Continue to drink plenty of fluids and stay well-hydrated Continue to work on weight with diet and exercise Avoid accidents  Arrie Senate MD

## 2019-02-06 NOTE — Patient Instructions (Addendum)
Continue current medications. Continue good therapeutic lifestyle changes which include good diet and exercise. Fall precautions discussed with patient. If an FOBT was given today- please return it to our front desk. If you are over 64 years old - you may need Prevnar 31 or the adult Pneumonia vaccine.  **Flu shots are available--- please call and schedule a FLU-CLINIC appointment**  After your visit with [place] today you will receive a survey in the mail or online from [employer] regarding your care with [place]. Please take a moment to fill this out. Your feedback is very important to [place] as you can help [place] better understand your patient needs as well as improve your experience and satisfaction. WE CARE ABOUT YOU!!!   Please call back with information regarding your last x-ray of your chest so that we can compare it to the one that was done in March of 19th to see if you need additional scans or not Follow-up with pulmonology and cardiology as recommended by the specialists Continue to drink plenty of fluids and stay well-hydrated Continue to work on weight with diet and exercise Avoid accidents

## 2019-02-07 LAB — CBC WITH DIFFERENTIAL/PLATELET
BASOS ABS: 0.1 10*3/uL (ref 0.0–0.2)
Basos: 1 %
EOS (ABSOLUTE): 0.2 10*3/uL (ref 0.0–0.4)
EOS: 3 %
HEMATOCRIT: 49 % (ref 37.5–51.0)
HEMOGLOBIN: 16 g/dL (ref 13.0–17.7)
IMMATURE GRANS (ABS): 0 10*3/uL (ref 0.0–0.1)
Immature Granulocytes: 0 %
LYMPHS: 22 %
Lymphocytes Absolute: 1.5 10*3/uL (ref 0.7–3.1)
MCH: 30.4 pg (ref 26.6–33.0)
MCHC: 32.7 g/dL (ref 31.5–35.7)
MCV: 93 fL (ref 79–97)
MONOCYTES: 9 %
Monocytes Absolute: 0.6 10*3/uL (ref 0.1–0.9)
NEUTROS ABS: 4.3 10*3/uL (ref 1.4–7.0)
Neutrophils: 65 %
Platelets: 299 10*3/uL (ref 150–450)
RBC: 5.27 x10E6/uL (ref 4.14–5.80)
RDW: 11.9 % (ref 11.6–15.4)
WBC: 6.6 10*3/uL (ref 3.4–10.8)

## 2019-02-07 LAB — BMP8+EGFR
BUN / CREAT RATIO: 10 (ref 10–24)
BUN: 14 mg/dL (ref 8–27)
CHLORIDE: 99 mmol/L (ref 96–106)
CO2: 25 mmol/L (ref 20–29)
CREATININE: 1.36 mg/dL — AB (ref 0.76–1.27)
Calcium: 9.5 mg/dL (ref 8.6–10.2)
GFR calc non Af Amer: 55 mL/min/{1.73_m2} — ABNORMAL LOW (ref >59)
GFR, EST AFRICAN AMERICAN: 64 mL/min/{1.73_m2} (ref >59)
Glucose: 110 mg/dL — ABNORMAL HIGH (ref 65–99)
Potassium: 4.4 mmol/L (ref 3.5–5.2)
Sodium: 138 mmol/L (ref 134–144)

## 2019-02-07 LAB — LIPID PANEL
CHOL/HDL RATIO: 2.4 ratio (ref 0.0–5.0)
CHOLESTEROL TOTAL: 106 mg/dL (ref 100–199)
HDL: 44 mg/dL (ref >39)
LDL CALC: 46 mg/dL (ref 0–99)
TRIGLYCERIDES: 79 mg/dL (ref 0–149)
VLDL CHOLESTEROL CAL: 16 mg/dL (ref 5–40)

## 2019-02-07 LAB — HEPATIC FUNCTION PANEL
ALK PHOS: 89 IU/L (ref 39–117)
ALT: 27 IU/L (ref 0–44)
AST: 24 IU/L (ref 0–40)
Albumin: 4.4 g/dL (ref 3.8–4.8)
Bilirubin Total: 0.6 mg/dL (ref 0.0–1.2)
Bilirubin, Direct: 0.19 mg/dL (ref 0.00–0.40)
TOTAL PROTEIN: 6.6 g/dL (ref 6.0–8.5)

## 2019-02-07 LAB — VITAMIN D 25 HYDROXY (VIT D DEFICIENCY, FRACTURES): Vit D, 25-Hydroxy: 58.2 ng/mL (ref 30.0–100.0)

## 2019-02-12 ENCOUNTER — Encounter: Payer: Self-pay | Admitting: *Deleted

## 2019-02-12 ENCOUNTER — Telehealth: Payer: Self-pay | Admitting: Family Medicine

## 2019-02-12 ENCOUNTER — Other Ambulatory Visit: Payer: Self-pay | Admitting: *Deleted

## 2019-02-12 DIAGNOSIS — R7989 Other specified abnormal findings of blood chemistry: Secondary | ICD-10-CM

## 2019-02-12 NOTE — Telephone Encounter (Signed)
Patient aware of results.

## 2019-03-12 ENCOUNTER — Other Ambulatory Visit: Payer: Self-pay | Admitting: Family Medicine

## 2019-03-12 ENCOUNTER — Other Ambulatory Visit: Payer: 59

## 2019-03-12 ENCOUNTER — Other Ambulatory Visit: Payer: Self-pay

## 2019-03-12 DIAGNOSIS — R911 Solitary pulmonary nodule: Secondary | ICD-10-CM

## 2019-03-12 DIAGNOSIS — R7989 Other specified abnormal findings of blood chemistry: Secondary | ICD-10-CM

## 2019-03-13 LAB — BMP8+EGFR
BUN/Creatinine Ratio: 14 (ref 10–24)
BUN: 18 mg/dL (ref 8–27)
CALCIUM: 9.3 mg/dL (ref 8.6–10.2)
CO2: 25 mmol/L (ref 20–29)
CREATININE: 1.25 mg/dL (ref 0.76–1.27)
Chloride: 99 mmol/L (ref 96–106)
GFR calc Af Amer: 70 mL/min/{1.73_m2} (ref >59)
GFR calc non Af Amer: 61 mL/min/{1.73_m2} (ref >59)
Glucose: 107 mg/dL — ABNORMAL HIGH (ref 65–99)
Potassium: 4.7 mmol/L (ref 3.5–5.2)
Sodium: 139 mmol/L (ref 134–144)

## 2019-03-18 ENCOUNTER — Other Ambulatory Visit: Payer: Self-pay | Admitting: Family Medicine

## 2019-04-03 ENCOUNTER — Other Ambulatory Visit: Payer: Self-pay | Admitting: Family Medicine

## 2019-04-17 ENCOUNTER — Other Ambulatory Visit: Payer: Self-pay | Admitting: Family Medicine

## 2019-04-22 MED ORDER — AZELASTINE HCL 0.1 % NA SOLN: 1.0000 | Freq: Every day | NASAL | 2 refills | Status: DC

## 2019-04-22 NOTE — Addendum Note (Signed)
Addended by: Antonietta Barcelona D on: 04/22/2019 07:06 PM   Modules accepted: Orders

## 2019-04-22 NOTE — Telephone Encounter (Signed)
Refill failed, resent 

## 2019-05-15 ENCOUNTER — Telehealth: Payer: Self-pay | Admitting: *Deleted

## 2019-05-15 NOTE — Telephone Encounter (Signed)

## 2019-05-18 ENCOUNTER — Ambulatory Visit (INDEPENDENT_AMBULATORY_CARE_PROVIDER_SITE_OTHER)
Admission: RE | Admit: 2019-05-18 | Discharge: 2019-05-18 | Disposition: A | Discharge status: Home / Self Care | Payer: 59 | Source: Ambulatory Visit | Attending: Pulmonary Disease | Admitting: Pulmonary Disease

## 2019-05-18 ENCOUNTER — Other Ambulatory Visit: Payer: Self-pay

## 2019-05-18 DIAGNOSIS — J849 Interstitial pulmonary disease, unspecified: Secondary | ICD-10-CM | POA: Diagnosis not present

## 2019-05-20 ENCOUNTER — Telehealth: Payer: Self-pay | Admitting: Pulmonary Disease

## 2019-05-20 NOTE — Telephone Encounter (Signed)
Notes recorded by Valerie Salts, CMA on 05/20/2019 at 2:08 PM EDT Left message for patient to call back. ------  Notes recorded by Juanito Doom, MD on 05/19/2019 at 7:08 AM EDT C Please let the patient know this showed scarring in his lungs, needs NP visit Thanks, B  Spoke with patient. He verbalized understanding. He has been scheduled with Aaron Edelman for 6/10 at 1030. Patient is aware of the [facility] office location (he was a [facility] patient). COVID screening was negative.   Nothing further needed at time of call.

## 2019-05-26 NOTE — Progress Notes (Signed)
@Patient  ID: Norman Briggs, male    DOB: 01/02/1956, 63 y.o.   MRN: [id number]  Chief Complaint  Patient presents with   Follow-up    Patient here for CT results. No other concerns at this time.     Referring provider: Chipper Herb, MD  HPI:  63 year old male former smoker followed in our office for interstitial lung disease likely NSIP  PMH: Hypertension, hyperlipidemia, vitamin D deficiency, aortic arthrosclerosis Smoker/ Smoking History: Former smoker.  Quit 2011. Maintenance: None Pt of: Dr. Lake Bells  05/27/2019  - Visit   63 year old male former smoker followed in our office for interstitial lung disease found on CT in 2019 during a cardiology work-up.  Patient had a consult with Dr. Lake Bells last year and a high-resolution CT chest was ordered to be completed in 2020.  Patient is here to review those results.  Since then patient is also had pulmonary function testing which is been normal.  Patient reports he has had no clinical changes in his breathing since last office visit a year ago.  He reports that he still walks 2-1/2 miles a day 5 days a week in under 30 minutes.  Patient is still golfing 3-4 times a week sometimes using a golf cart and sometimes walking 18 holes.  Patient reports that he overall is doing well.    Patient does admit that after last office visit he realized that he has had some exposures to chemicals which was a question the Dr. Lake Bells asked him.  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  Patient denies any family history of chronic lung problems or connective tissue disorders.     Tests:   05/18/2019-CT chest high-res-stable findings in the lungs compatible with interstitial lung disease with a spectrum  of findings considered most indicative of an alternative diagnosis to UIP per current ATS guidelines this again is favored to reflect mild NSIP  02/20/2018-CT chest without contrast-appearance of lungs is similar to the prior study of September/2018 with a spectrum of findings suggesting underlying interstitial lung disease such as mild chronic hypersensitivity pneumonitis or nonspecific interstitial pneumonia NSIP repeat high-resolution CT chest in 12 months to assess for temporal changes in the appearance of the lung parenchyma  10/08/2018-echocardiogram-LV ejection fraction 55 to 60%, mild mitral valve regurgitation  04/23/2018-pulmonary function test- FVC 4.80 (100% predicted), postbronchodilator ratio 75, postbronchodilator FEV1 3.61 (100% predicted), no bronchodilator response, DLCO 105  FENO:  No results found for: NITRICOXIDE  PFT: PFT Results Latest Ref Rng & Units 04/23/2018  FVC-Pre L 4.80  FVC-Predicted Pre % 100  FVC-Post L 4.80  FVC-Predicted Post % 100  Pre FEV1/FVC % % 72  Post FEV1/FCV % % 75  FEV1-Pre L 3.46  FEV1-Predicted Pre % 96  FEV1-Post L 3.61  DLCO UNC% % 105  DLCO COR %Predicted % 88  TLC L 7.71  TLC % Predicted % 108  RV % Predicted % 67    Imaging: Ct Chest High Resolution  Result Date: 05/18/2019 CLINICAL DATA:  63 year old male with history of interstitial lung disease. Follow-up study. EXAM: CT CHEST WITHOUT CONTRAST TECHNIQUE: Multidetector CT imaging of the chest was performed following the standard protocol without intravenous contrast.  High resolution imaging of the lungs, as well as inspiratory and expiratory imaging, was performed. COMPARISON:  Chest CT 02/20/2018. FINDINGS: Cardiovascular: Heart size is normal. There is no significant pericardial fluid, thickening or pericardial calcification. There is aortic atherosclerosis, as well as atherosclerosis of the great vessels of the mediastinum and the coronary arteries, including calcified atherosclerotic  plaque in the left anterior descending and right coronary arteries. Mediastinum/Nodes: No pathologically enlarged mediastinal or hilar lymph nodes. Please note that accurate exclusion of hilar adenopathy is limited on noncontrast CT scans. Esophagus is unremarkable in appearance. No axillary lymphadenopathy. Lungs/Pleura: High-resolution images again demonstrate some very mild patchy peripheral predominant areas of ground-glass attenuation and septal thickening scattered throughout the lungs bilaterally, with no definitive craniocaudal gradient. Findings appear essentially stable compared to the prior study from 02/20/2018. No significant bronchiectasis or honeycombing. Inspiratory and expiratory imaging demonstrates moderate air trapping indicative of small airways disease. No acute consolidative airspace disease. No pleural effusions. A few scattered calcified granulomas are noted. No suspicious appearing pulmonary nodules or masses. Upper Abdomen: Unremarkable. Musculoskeletal: There are no aggressive appearing lytic or blastic lesions noted in the visualized portions of the skeleton. IMPRESSION: 1. Stable findings in the lungs compatible with interstitial lung disease, with a spectrum of findings considered most indicative of an alternative diagnosis to usual interstitial pneumonia (UIP) per current ATS guidelines. This is again favored to reflect mild nonspecific interstitial pneumonia (NSIP). 2. Aortic atherosclerosis, in addition to 2 vessel coronary artery disease. Please note that although the presence of coronary artery calcium documents the presence of coronary artery disease, the severity of this disease and any potential stenosis cannot be assessed on this non-gated CT examination. Assessment for potential risk factor modification, dietary therapy or pharmacologic therapy may be warranted, if clinically indicated. Aortic Atherosclerosis (ICD10-I70.0). Electronically Signed   By: Vinnie Langton M.D.    On: 05/18/2019 08:55      Specialty Problems      Pulmonary Problems   Allergic rhinitis   Interstitial pulmonary disease ([place])    02/20/2018-CT chest without contrast-appearance of lungs is similar to the prior study of September/2018 with a spectrum of findings suggesting underlying interstitial lung disease such as mild chronic hypersensitivity pneumonitis or nonspecific interstitial pneumonia NSIP repeat high-resolution CT chest in 12 months to assess for temporal changes in the appearance of the lung parenchyma  05/18/2019-CT chest high-res-stable findings in the lungs compatible with interstitial lung disease with a spectrum of findings considered most indicative of an alternative diagnosis to UIP per current ATS guidelines this again is favored to reflect mild NSIP  04/23/2018-pulmonary function test- FVC 4.80 (100% predicted), postbronchodilator ratio 75, postbronchodilator FEV1 3.61 (100% predicted), no bronchodilator response, DLCO 105         Allergies  Allergen Reactions   Lipitor [Atorvastatin]     Joint pain and myalgias   Anaprox [Naproxen Sodium] Rash   Lisinopril Cough    Immunization History  Administered Date(s) Administered   Influenza,inj,Quad PF,6+ Mos 01/07/2014, 09/19/2015, 10/15/2016, 11/12/2017, 10/21/2018   Influenza,inj,quad, With Preservative 11/25/2014   Tdap 03/07/2010    Past Medical History:  Diagnosis Date   Allergy    Aortic atherosclerosis ([place]) 04/23/2018   Per CT scan   Arthritis    shoulder-right   Hyperlipidemia    Hypertension    Vitamin D deficiency     Tobacco History: Social History   Tobacco Use  Smoking Status Former Smoker   Packs/day: 0.50   Types: Cigarettes   Last  attempt to quit: 07/02/2010   Years since quitting: 8.9  Smokeless Tobacco Never Used  Tobacco Comment   "social smoker" per patient.    Counseling given: Yes Comment: "social smoker" per patient.    Continue to not smoke  Outpatient  Encounter Medications as of 05/27/2019  Medication Sig   amLODipine-valsartan (EXFORGE) 5-320 MG tablet Take 1 tablet by mouth daily.   aspirin EC 81 MG tablet Take 81 mg by mouth daily.   azelastine (ASTELIN) 0.1 % nasal spray Place 1 spray into both nostrils at bedtime. Use in each nostril as directed   Cholecalciferol (VITAMIN D-3) 5000 UNITS TABS Take 1 tablet by mouth daily.   hydrochlorothiazide (HYDRODIURIL) 25 MG tablet TAKE 1 TABLET PER DAY AS DIRECTED   Multiple Vitamins-Minerals (EYE-VITE PLUS LUTEIN) CAPS Take 1 capsule by mouth daily.    Omega-3 Fatty Acids (FISH OIL) 1000 MG CAPS Take 2 capsules by mouth daily.   rosuvastatin (CRESTOR) 10 MG tablet TAKE 1 TABLET (10 MG TOTAL) BY MOUTH DAILY. GENERIC (Patient taking differently: Take 5 mg by mouth daily. )   sildenafil (REVATIO) 20 MG tablet TAKE 2-5 TABLETS BY MOUTH ONCE DAILY AS NEEDED FOR SEXUAL ACTIVITY   No facility-administered encounter medications on file as of 05/27/2019.      Review of Systems  Review of Systems  Constitutional: Negative for activity change, chills, fatigue, fever and unexpected weight change.  HENT: Negative for postnasal drip, rhinorrhea, sneezing and sore throat.   Eyes: Negative.   Respiratory: Negative for cough, shortness of breath and wheezing.   Cardiovascular: Negative for chest pain and palpitations.  Gastrointestinal: Negative for diarrhea, nausea and vomiting.  Endocrine: Negative.   Musculoskeletal: Negative.   Skin: Negative.   Neurological: Negative for dizziness and headaches.  Psychiatric/Behavioral: Negative.  Negative for dysphoric mood. The patient is not nervous/anxious.   All other systems reviewed and are negative.    Physical Exam  BP 118/74 (BP Location: Left Arm, Cuff Size: Normal)    Pulse 83    Temp (!) 97.5 F (36.4 C) (Oral)    Ht 5\' 11"  (1.803 m)    Wt 214 lb 12.8 oz (97.4 kg)    SpO2 97%    BMI 29.96 kg/m   Wt Readings from Last 5 Encounters:    05/27/19 214 lb 12.8 oz (97.4 kg)  02/06/19 221 lb (100.2 kg)  08/06/18 216 lb (98 kg)  04/03/18 221 lb (100.2 kg)  02/05/18 216 lb (98 kg)     Physical Exam  Constitutional: He is oriented to person, place, and time and well-developed, well-nourished, and in no distress. No distress.  HENT:  Head: Normocephalic and atraumatic.  Right Ear: Hearing, tympanic membrane, external ear and ear canal normal.  Left Ear: Hearing, tympanic membrane, external ear and ear canal normal.  Nose: Nose normal.  Mouth/Throat: Uvula is midline and oropharynx is clear and moist. No oropharyngeal exudate.  Eyes: Pupils are equal, round, and reactive to light.  Neck: Normal range of motion. Neck supple.  Cardiovascular: Normal rate, regular rhythm and normal heart sounds.  Pulmonary/Chest: Effort normal and breath sounds normal. No accessory muscle usage. No respiratory distress. He has no decreased breath sounds. He has no wheezes. He has no rhonchi. He has no rales.  Abdominal: Soft. Bowel sounds are normal. He exhibits no distension. There is no abdominal tenderness.  Musculoskeletal: Normal range of motion.        General: No edema.  Lymphadenopathy:    He  has no cervical adenopathy.  Neurological: He is alert and oriented to person, place, and time. Gait normal.  Skin: Skin is warm and dry. He is not diaphoretic. No erythema.  Psychiatric: Mood, memory, affect and judgment normal.  Nursing note and vitals reviewed.     Lab Results:  CBC    Component Value Date/Time   WBC 6.6 02/06/2019 1203   WBC 7.8 07/19/2015 1206   RBC 5.27 02/06/2019 1203   RBC 5.31 07/19/2015 1206   HGB 16.0 02/06/2019 1203   HCT 49.0 02/06/2019 1203   PLT 299 02/06/2019 1203   MCV 93 02/06/2019 1203   MCH 30.4 02/06/2019 1203   MCH 29.3 07/19/2015 1206   MCHC 32.7 02/06/2019 1203   MCHC 31.6 (A) 07/19/2015 1206   RDW 11.9 02/06/2019 1203   LYMPHSABS 1.5 02/06/2019 1203   EOSABS 0.2 02/06/2019 1203    BASOSABS 0.1 02/06/2019 1203    BMET    Component Value Date/Time   NA 139 03/12/2019 0856   K 4.7 03/12/2019 0856   CL 99 03/12/2019 0856   CO2 25 03/12/2019 0856   GLUCOSE 107 (H) 03/12/2019 0856   GLUCOSE 101 (H) 07/02/2013 0930   BUN 18 03/12/2019 0856   CREATININE 1.25 03/12/2019 0856   CREATININE 1.08 07/02/2013 0930   CALCIUM 9.3 03/12/2019 0856   GFRNONAA 61 03/12/2019 0856   GFRNONAA 76 07/02/2013 0930   GFRAA 70 03/12/2019 0856   GFRAA 88 07/02/2013 0930    BNP No results found for: BNP  ProBNP No results found for: PROBNP    Assessment & Plan:   Interstitial pulmonary disease ([place]) Assessment: March/2019 CT chest shows appearance of lungs suggest ILD potentially mild chronic hypersensitivity pneumonitis or NSIP May/2019 pulmonary function test shows FVC 4.80 (100% predicted), FEV1 3.61 (100% predicted), DLCO 105 June/2020 CT chest high-res shows stable findings in the lungs ILD most indicative of mild NSIP Patient reporting no clinical changes over the last year Patient denies family history of connective tissue disease or chronic lung problems No current clinical symptoms or breathing problems at this time  Plan: We will repeat high-resolution chest CT in 2 years to follow NSIP pattern seen on CT Contact our office sooner if you have worsened problems with your breathing Can consider connective tissue work-up as well as additional breathing test if patient symptoms worsen     Return in about 2 years (around 05/26/2021), or if symptoms worsen or fail to improve, for After Chest CT.   Lauraine Rinne, NP 05/27/2019   This appointment was 26 minutes long with over 50% of the time in direct face-to-face patient care, assessment, plan of care, and follow-up.

## 2019-05-27 ENCOUNTER — Ambulatory Visit (INDEPENDENT_AMBULATORY_CARE_PROVIDER_SITE_OTHER): Payer: 59 | Admitting: Pulmonary Disease

## 2019-05-27 ENCOUNTER — Other Ambulatory Visit: Payer: Self-pay

## 2019-05-27 ENCOUNTER — Encounter: Payer: Self-pay | Admitting: Pulmonary Disease

## 2019-05-27 DIAGNOSIS — J849 Interstitial pulmonary disease, unspecified: Secondary | ICD-10-CM

## 2019-05-27 NOTE — Patient Instructions (Addendum)
High-resolution chest CT in 2 years  Notify our office if you have worsen respiratory symptoms such as shortness of breath, wheezing, cough  Return in about 2 years (around 05/26/2021), or if symptoms worsen or fail to improve, for After Chest CT.   Coronavirus (COVID-19) Are you at risk?  Are you at risk for the Coronavirus (COVID-19)?  To be considered HIGH RISK for Coronavirus (COVID-19), you have to meet the following criteria:  . Traveled to [place], [place], [place], [place] or [place]; or in the [place] to [place], [place], [place], or [place]; and have fever, cough, and shortness of breath within the last 2 weeks of travel OR . Been in close contact with a person diagnosed with COVID-19 within the last 2 weeks and have fever, cough, and shortness of breath . IF YOU DO NOT MEET THESE CRITERIA, YOU ARE CONSIDERED LOW RISK FOR COVID-19.  What to do if you are HIGH RISK for COVID-19?  Marland Kitchen If you are having a medical emergency, call 911. . Seek medical care right away. Before you go to a doctor's office, urgent care or emergency department, call ahead and tell them about your recent travel, contact with someone diagnosed with COVID-19, and your symptoms. You should receive instructions from your physician's office regarding next steps of care.  . When you arrive at healthcare provider, tell the healthcare staff immediately you have returned from visiting [place], [place], [place], [place] or [place]; or traveled in the [place] to [place], [place], [place], or [place]; in the last two weeks or you have been in close contact with a person diagnosed with COVID-19 in the last 2 weeks.   . Tell the health care staff about your symptoms: fever, cough and shortness of breath. . After you have been seen by a medical provider, you will be either: o Tested for (COVID-19) and discharged home on quarantine except to seek medical care if symptoms worsen, and asked to  - Stay home  and avoid contact with others until you get your results (4-5 days)  - Avoid travel on public transportation if possible (such as bus, train, or airplane) or o Sent to the Emergency Department by EMS for evaluation, COVID-19 testing, and possible admission depending on your condition and test results.  What to do if you are LOW RISK for COVID-19?  Reduce your risk of any infection by using the same precautions used for avoiding the common cold or flu:  Marland Kitchen Wash your hands often with soap and warm water for at least 20 seconds.  If soap and water are not readily available, use an alcohol-based hand sanitizer with at least 60% alcohol.  . If coughing or sneezing, cover your mouth and nose by coughing or sneezing into the elbow areas of your shirt or coat, into a tissue or into your sleeve (not your hands). . Avoid shaking hands with others and consider head nods or verbal greetings only. . Avoid touching your eyes, nose, or mouth with unwashed hands.  . Avoid close contact with people who are sick. . Avoid places or events with large numbers of people in one location, like concerts or sporting events. . Carefully consider travel plans you have or are making. . If you are planning any travel outside or inside the [place], visit the CDC's Travelers' Health webpage for the latest health notices. . If you have some symptoms but not all symptoms, continue to monitor at home and seek medical  attention if your symptoms worsen. . If you are having a medical emergency, call 911.   [place] / e-Visit: [email or web address]         [facility]: [facility]: [phone]                   [facility]: [phone]           It is flu season:   >>> Best ways to protect herself from the flu: Receive the yearly flu vaccine, practice good hand hygiene  washing with soap and also using hand sanitizer when available, eat a nutritious meals, get adequate rest, hydrate appropriately   Please contact the office if your symptoms worsen or you have concerns that you are not improving.   Thank you for choosing [facility] for your healthcare, and for allowing [place] to partner with you on your healthcare journey. I am thankful to be able to provide care to you today.   Wyn Quaker FNP-C

## 2019-05-27 NOTE — Progress Notes (Signed)
Reviewed, discussed, agree 

## 2019-05-27 NOTE — Assessment & Plan Note (Signed)
Assessment: March/2019 CT chest shows appearance of lungs suggest ILD potentially mild chronic hypersensitivity pneumonitis or NSIP May/2019 pulmonary function test shows FVC 4.80 (100% predicted), FEV1 3.61 (100% predicted), DLCO 105 June/2020 CT chest high-res shows stable findings in the lungs ILD most indicative of mild NSIP Patient reporting no clinical changes over the last year Patient denies family history of connective tissue disease or chronic lung problems No current clinical symptoms or breathing problems at this time  Plan: We will repeat high-resolution chest CT in 2 years to follow NSIP pattern seen on CT Contact our office sooner if you have worsened problems with your breathing Can consider connective tissue work-up as well as additional breathing test if patient symptoms worsen

## 2019-06-12 ENCOUNTER — Telehealth: Payer: Self-pay | Admitting: Pulmonary Disease

## 2019-06-12 NOTE — Telephone Encounter (Signed)
06/12/2019 0851  Lauren, can you please contact the patient let him know that we have heard back from Dr. Lake Bells and Dr. Lake Bells agreed with the work-up but did recommend having the patient come in for a one-year follow-up in June/2021 for a walk as well as a ambulatory O2 check.  We can place a recall for the patient have an appointment with Dr. Lake Bells at that time.  Wyn Quaker, FNP

## 2019-06-12 NOTE — Telephone Encounter (Signed)
-----   Message from Juanito Doom, MD sent at 05/27/2019  5:15 PM EDT ----- Regarding: RE: CT and Follow up Absolutely I would have him follow annually for an ambulatory O2 check and make sure symptoms aren't worsening Good plan ----- Message ----- From: Lauraine Rinne, NP Sent: 05/27/2019  11:44 AM EDT To: Juanito Doom, MD Subject: CT and Follow up                               McQuaid,Saw this patient for follow-up in office.  Overall patient is doing quite well and is quite active.  Pulmonary function testing is normal.  His repeat high-resolution chest CT still continues to show mild NSIP that has not progressed.  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  Patient also reports that when he was younger he [employer] for 6 years where he was exposed to pesticides.  Patient denies family history of lung problems or connective tissue disorders.Due to the fact the patient is clinically stable and so unchanged over the last year I did not feel it was necessary to do a connective tissue work-up today.  I told him that if symptoms change or breathing worsens then we may need to do additional blood work as well as a breathing test to further evaluate.Can I repeat high-resolution chest CT in 2 years to follow the patient.  He is reporting to me that in about 3 weeks he will stop having health insurance and will not have it for about a year and a half before he starts Medicare.  He knows to follow-up with our office sooner if breathing or respiratory symptoms worsen.Just wanted to give you an FYI when you saw such a long follow-up for a 2-year follow-up in a 2-year CT chest with the patient.Let me know if you have any questions or concernsBrian

## 2019-06-12 NOTE — Telephone Encounter (Signed)
Per DPR may leave detailed message on phone. I left patient a message and placed recall. Nothing further needed.

## 2019-07-03 ENCOUNTER — Encounter: Payer: Self-pay | Admitting: *Deleted

## 2019-08-07 ENCOUNTER — Other Ambulatory Visit: Payer: Self-pay | Admitting: Family Medicine

## 2019-08-08 ENCOUNTER — Other Ambulatory Visit: Payer: Self-pay | Admitting: Family Medicine

## 2019-08-11 ENCOUNTER — Ambulatory Visit: Payer: 59 | Admitting: Family Medicine

## 2019-08-11 ENCOUNTER — Encounter: Payer: Self-pay | Admitting: Family Medicine

## 2019-08-11 ENCOUNTER — Ambulatory Visit (INDEPENDENT_AMBULATORY_CARE_PROVIDER_SITE_OTHER): Payer: BLUE CROSS/BLUE SHIELD | Admitting: Family Medicine

## 2019-08-11 DIAGNOSIS — E7800 Pure hypercholesterolemia, unspecified: Secondary | ICD-10-CM | POA: Diagnosis not present

## 2019-08-11 DIAGNOSIS — H938X1 Other specified disorders of right ear: Secondary | ICD-10-CM

## 2019-08-11 DIAGNOSIS — I10 Essential (primary) hypertension: Secondary | ICD-10-CM | POA: Diagnosis not present

## 2019-08-11 DIAGNOSIS — L723 Sebaceous cyst: Secondary | ICD-10-CM

## 2019-08-11 DIAGNOSIS — J849 Interstitial pulmonary disease, unspecified: Secondary | ICD-10-CM

## 2019-08-11 MED ORDER — HYDROCHLOROTHIAZIDE 25 MG PO TABS: 12.5000 mg | ORAL_TABLET | Freq: Every day | ORAL | 3 refills | Status: DC

## 2019-08-11 MED ORDER — ROSUVASTATIN CALCIUM 10 MG PO TABS: 5.0000 mg | ORAL_TABLET | Freq: Every day | ORAL | 2 refills | Status: DC

## 2019-08-11 MED ORDER — AMLODIPINE BESYLATE-VALSARTAN 5-320 MG PO TABS: 1.0000 | ORAL_TABLET | Freq: Every day | ORAL | 2 refills | Status: DC

## 2019-08-11 NOTE — Progress Notes (Signed)
Telephone visit  Subjective: CC: HTN, HLD PCP: Janora Norlander, DO [id number] Pagni is a 63 y.o. male calls for telephone consult today. Patient provides verbal consent for consult held via phone.  Location of patient: home Location of provider: Working remotely from home Others present for call: none  1. HTN, HLD Patient reports compliance with Exforge and half a tablet of hydrochlorothiazide daily.  No chest pain, shortness of breath.  He is physically active and tries to maintain a healthy diet.  2.  Interstitial pulmonary disease Patient notes this was found incidentally and has been followed by Dr. Lake Bells, pulmonologist.  He had a checkup in June and notes stability.  Again no shortness of breath or change in exercise tolerance  3.  Sebaceous cyst Patient was noted to have a sebaceous cyst of the left thigh at his last visit with his PCP.  He was instructed to follow-up with his dermatologist but notes that his dermatologist has since stopped seeing patients and is now in research only.  He is asking for referral to a new dermatologist.  4.  Right ear irritation Patient reports that he was cleaning out his ear, right side, a couple of months ago and accidentally got some cotton stuck in the ear.  He was able to get some of the cotton out of his ear but states that he has since had a water sensation in the right ear.  He worries that there is retained wax/foreign body.  He notes that it is quite irritating.  No fevers, discharge.   ROS: Per HPI  Allergies  Allergen Reactions  . Lipitor [Atorvastatin]     Joint pain and myalgias  . Anaprox [Naproxen Sodium] Rash  . Lisinopril Cough   Past Medical History:  Diagnosis Date  . Allergy   . Aortic atherosclerosis ([place]) 04/23/2018   Per CT scan  . Arthritis    shoulder-right  . Hyperlipidemia   . Hypertension   . Vitamin D deficiency     Current Outpatient Medications:  .  amLODipine-valsartan (EXFORGE) 5-320 MG tablet,  TAKE 1 TABLET BY MOUTH EVERY DAY, Disp: 90 tablet, Rfl: 0 .  aspirin EC 81 MG tablet, Take 81 mg by mouth daily., Disp: , Rfl:  .  azelastine (ASTELIN) 0.1 % nasal spray, Place 1 spray into both nostrils at bedtime. Use in each nostril as directed, Disp: 30 mL, Rfl: 2 .  Cholecalciferol (VITAMIN D-3) 5000 UNITS TABS, Take 1 tablet by mouth daily., Disp: , Rfl:  .  hydrochlorothiazide (HYDRODIURIL) 25 MG tablet, TAKE 1 TABLET PER DAY AS DIRECTED, Disp: 90 tablet, Rfl: 0 .  Multiple Vitamins-Minerals (EYE-VITE PLUS LUTEIN) CAPS, Take 1 capsule by mouth daily. , Disp: , Rfl:  .  Omega-3 Fatty Acids (FISH OIL) 1000 MG CAPS, Take 2 capsules by mouth daily., Disp: , Rfl:  .  rosuvastatin (CRESTOR) 10 MG tablet, TAKE 1/2 TABLET BY MOUTH DAILY, Disp: 45 tablet, Rfl: 0 .  sildenafil (REVATIO) 20 MG tablet, TAKE 2-5 TABLETS BY MOUTH ONCE DAILY AS NEEDED FOR SEXUAL ACTIVITY, Disp: 50 tablet, Rfl: 4  Assessment/ Plan: 63 y.o. male   1. Essential hypertension Stable - amLODipine-valsartan (EXFORGE) 5-320 MG tablet; Take 1 tablet by mouth daily.  Dispense: 90 tablet; Refill: 2 - hydrochlorothiazide (HYDRODIURIL) 25 MG tablet; Take 0.5 tablets (12.5 mg total) by mouth daily.  Dispense: 45 tablet; Refill: 3  2. Pure hypercholesterolemia Stable - rosuvastatin (CRESTOR) 10 MG tablet; Take 0.5 tablets (5 mg total) by mouth  daily.  Dispense: 45 tablet; Refill: 2  3. Interstitial pulmonary disease ([place]) Asymptomatic. Being followed by Dr Lake Bells  4. Sebaceous cyst Referred to derm.  His previous dermatologist no longer sees patients - Ambulatory referral to Dermatology  5. Irritation of ear, right Will come in tomorrow for ear irrigation   Start time: 8:00am End time: 8:16am  Total time spent on patient care (including telephone call/ virtual visit): 22 minutes  [place], [place] [phone]

## 2019-08-12 ENCOUNTER — Ambulatory Visit (INDEPENDENT_AMBULATORY_CARE_PROVIDER_SITE_OTHER): Payer: BLUE CROSS/BLUE SHIELD | Admitting: Family Medicine

## 2019-08-12 ENCOUNTER — Other Ambulatory Visit: Payer: Self-pay

## 2019-08-12 VITALS — BP 129/81 | HR 68 | Temp 98.6°F | Ht 71.0 in | Wt 214.0 lb

## 2019-08-12 DIAGNOSIS — T161XXA Foreign body in right ear, initial encounter: Secondary | ICD-10-CM

## 2019-08-12 DIAGNOSIS — L723 Sebaceous cyst: Secondary | ICD-10-CM

## 2019-08-12 NOTE — Progress Notes (Signed)
Subjective: CC: ear irritation PCP: Janora Norlander, DO Norman Briggs is a 63 y.o. male presenting to clinic today for:  1. Ear irritation This was discussed with patient during yesterday's telephone visit.  He is had a 2-1/103-month history of right-sided ear irritation and roaring in his ears.  He is here for evaluation for possible retained foreign body versus cerumen impaction.    ROS: Per HPI  Allergies  Allergen Reactions  . Lipitor [Atorvastatin]     Joint pain and myalgias  . Anaprox [Naproxen Sodium] Rash  . Lisinopril Cough   Past Medical History:  Diagnosis Date  . Allergy   . Aortic atherosclerosis ([place]) 04/23/2018   Per CT scan  . Arthritis    shoulder-right  . History of elevated PSA 07/02/2013  . Hyperlipidemia   . Hypertension   . Vitamin D deficiency     Current Outpatient Medications:  .  amLODipine-valsartan (EXFORGE) 5-320 MG tablet, Take 1 tablet by mouth daily., Disp: 90 tablet, Rfl: 2 .  aspirin EC 81 MG tablet, Take 81 mg by mouth daily., Disp: , Rfl:  .  azelastine (ASTELIN) 0.1 % nasal spray, Place 1 spray into both nostrils at bedtime. Use in each nostril as directed, Disp: 30 mL, Rfl: 2 .  Cholecalciferol (VITAMIN D-3) 5000 UNITS TABS, Take 1 tablet by mouth daily., Disp: , Rfl:  .  hydrochlorothiazide (HYDRODIURIL) 25 MG tablet, Take 0.5 tablets (12.5 mg total) by mouth daily., Disp: 45 tablet, Rfl: 3 .  Multiple Vitamins-Minerals (EYE-VITE PLUS LUTEIN) CAPS, Take 1 capsule by mouth daily. , Disp: , Rfl:  .  Omega-3 Fatty Acids (FISH OIL) 1000 MG CAPS, Take 2 capsules by mouth daily., Disp: , Rfl:  .  rosuvastatin (CRESTOR) 10 MG tablet, Take 0.5 tablets (5 mg total) by mouth daily., Disp: 45 tablet, Rfl: 2 .  sildenafil (REVATIO) 20 MG tablet, TAKE 2-5 TABLETS BY MOUTH ONCE DAILY AS NEEDED FOR SEXUAL ACTIVITY, Disp: 50 tablet, Rfl: 4 Social History   Socioeconomic History  . Marital status: Divorced    Spouse name: Not on file  .  Number of children: Not on file  . Years of education: Not on file  . Highest education level: Not on file  Occupational History  . Not on file  Social Needs  . Financial resource strain: Not on file  . Food insecurity    Worry: Not on file    Inability: Not on file  . Transportation needs    Medical: Not on file    Non-medical: Not on file  Tobacco Use  . Smoking status: Former Smoker    Packs/day: 0.50    Types: Cigarettes    Quit date: 07/02/2010    Years since quitting: 9.1  . Smokeless tobacco: Never Used  . Tobacco comment: "social smoker" per patient.   Substance and Sexual Activity  . Alcohol use: Yes    Alcohol/week: 4.0 standard drinks    Types: 4 Cans of beer per week  . Drug use: No  . Sexual activity: Not on file  Lifestyle  . Physical activity    Days per week: Not on file    Minutes per session: Not on file  . Stress: Not on file  Relationships  . Social Herbalist on phone: Not on file    Gets together: Not on file    Attends religious service: Not on file    Active member of club or organization: Not on file  Attends meetings of clubs or organizations: Not on file    Relationship status: Not on file  . Intimate partner violence    Fear of current or ex partner: Not on file    Emotionally abused: Not on file    Physically abused: Not on file    Forced sexual activity: Not on file  Other Topics Concern  . Not on file  Social History Narrative  . Not on file   Family History  Problem Relation Age of Onset  . Hypertension Mother   . Heart attack Father   . Colon cancer Neg Hx   . Colon polyps Neg Hx   . Esophageal cancer Neg Hx   . Rectal cancer Neg Hx   . Stomach cancer Neg Hx     Objective: Office vital signs reviewed. BP 129/81   Pulse 68   Temp 98.6 F (37 C) (Oral)   Ht 5\' 11"  (1.803 m)   Wt 214 lb (97.1 kg)   BMI 29.85 kg/m   Physical Examination:  General: Awake, alert, well nourished, No acute distress HEENT:  Normal    Ears: Tympanic membranes intact, normal light reflex, no erythema, no bulging; the base of the right external auditory canal with cerumen and skin buildup.  There is appreciable cotton retained in the ear as well.  No bleeding.   Assessment/ Plan: 63 y.o. male   1. Foreign body of right ear, initial encounter Improvement in symptoms and successful removal of the foreign body.  2. Sebaceous cyst Unfortunately the dermatology appointment was sent to [facility] but patient wanted [facility]  This is simply a replacement of that referral from yesterday. - Ambulatory referral to Dermatology   No orders of the defined types were placed in this encounter.  No orders of the defined types were placed in this encounter.    Janora Norlander, DO [place] [phone]

## 2019-08-25 ENCOUNTER — Other Ambulatory Visit: Payer: Self-pay | Admitting: Family Medicine

## 2019-08-25 DIAGNOSIS — L723 Sebaceous cyst: Secondary | ICD-10-CM

## 2020-05-02 ENCOUNTER — Other Ambulatory Visit: Payer: Self-pay | Admitting: Family Medicine

## 2020-05-02 DIAGNOSIS — I10 Essential (primary) hypertension: Secondary | ICD-10-CM

## 2020-05-25 ENCOUNTER — Other Ambulatory Visit: Payer: Self-pay | Admitting: Family Medicine

## 2020-05-25 DIAGNOSIS — I10 Essential (primary) hypertension: Secondary | ICD-10-CM

## 2020-06-01 ENCOUNTER — Ambulatory Visit (INDEPENDENT_AMBULATORY_CARE_PROVIDER_SITE_OTHER): Payer: BLUE CROSS/BLUE SHIELD | Admitting: Family Medicine

## 2020-06-01 ENCOUNTER — Other Ambulatory Visit: Payer: Self-pay

## 2020-06-01 ENCOUNTER — Encounter: Payer: Self-pay | Admitting: Family Medicine

## 2020-06-01 VITALS — BP 131/85 | HR 85 | Temp 97.7°F | Ht 71.0 in | Wt 207.0 lb

## 2020-06-01 DIAGNOSIS — I10 Essential (primary) hypertension: Secondary | ICD-10-CM | POA: Diagnosis not present

## 2020-06-01 DIAGNOSIS — Z23 Encounter for immunization: Secondary | ICD-10-CM | POA: Diagnosis not present

## 2020-06-01 DIAGNOSIS — J849 Interstitial pulmonary disease, unspecified: Secondary | ICD-10-CM

## 2020-06-01 DIAGNOSIS — M5416 Radiculopathy, lumbar region: Secondary | ICD-10-CM | POA: Insufficient documentation

## 2020-06-01 DIAGNOSIS — E559 Vitamin D deficiency, unspecified: Secondary | ICD-10-CM

## 2020-06-01 DIAGNOSIS — E7800 Pure hypercholesterolemia, unspecified: Secondary | ICD-10-CM | POA: Diagnosis not present

## 2020-06-01 DIAGNOSIS — I700 Atherosclerosis of aorta: Secondary | ICD-10-CM

## 2020-06-01 MED ORDER — METHOCARBAMOL 500 MG PO TABS: 500.0000 mg | ORAL_TABLET | Freq: Three times a day (TID) | ORAL | 0 refills | Status: DC | PRN

## 2020-06-01 MED ORDER — ETODOLAC 400 MG PO TABS: 400.0000 mg | ORAL_TABLET | Freq: Two times a day (BID) | ORAL | 2 refills | Status: DC | PRN

## 2020-06-01 MED ORDER — ROSUVASTATIN CALCIUM 10 MG PO TABS: 5.0000 mg | ORAL_TABLET | Freq: Every day | ORAL | 3 refills | Status: DC

## 2020-06-01 MED ORDER — HYDROCHLOROTHIAZIDE 25 MG PO TABS: 12.5000 mg | ORAL_TABLET | Freq: Every day | ORAL | 3 refills | Status: DC

## 2020-06-01 MED ORDER — AMLODIPINE BESYLATE-VALSARTAN 5-320 MG PO TABS: 1.0000 | ORAL_TABLET | Freq: Every day | ORAL | 3 refills | Status: DC

## 2020-06-01 NOTE — Patient Instructions (Signed)

## 2020-06-01 NOTE — Progress Notes (Signed)
Subjective: CC: HTN, HLD PCP: Norman Norlander, DO Norman Briggs is a 64 y.o. male presenting to clinic today for:  1. HTN, HLD Patient reports compliance with Exforge, hydrochlorothiazide and Crestor.  No chest pain, shortness of breath, lower extreme edema, visual disturbance or falls.  Sometimes he occasionally gets dizzy when he is out on the golf course but this is rare.  He remains physically active and in fact worked out this morning.  He is nonfasting, drink a couple coffee with creamer in it.  2.  Low back pain Patient reports right-sided buttock pain that occasionally radiates down into his right foot.  He points to his toe area as the area of pain and numbness and tingling.  He notes that symptoms onset last November and he was treated with a Toradol shot, etodolac and methocarbamol.  Symptoms did gradually improve.  However, he still has right-sided buttock pain that is present.  ROS: Per HPI  Allergies  Allergen Reactions  . Lipitor [Atorvastatin]     Joint pain and myalgias  . Anaprox [Naproxen Sodium] Rash  . Lisinopril Cough   Past Medical History:  Diagnosis Date  . Allergy   . Aortic atherosclerosis ([place]) 04/23/2018   Per CT scan  . Arthritis    shoulder-right  . History of elevated PSA 07/02/2013  . Hyperlipidemia   . Hypertension   . Vitamin D deficiency     Current Outpatient Medications:  .  amLODipine-valsartan (EXFORGE) 5-320 MG tablet, TAKE 1 TABLET BY MOUTH DAILY. (NEEDS TO BE SEEN BEFORE NEXT REFILL), Disp: 30 tablet, Rfl: 0 .  aspirin EC 81 MG tablet, Take 81 mg by mouth daily., Disp: , Rfl:  .  azelastine (ASTELIN) 0.1 % nasal spray, Place 1 spray into both nostrils at bedtime. Use in each nostril as directed, Disp: 30 mL, Rfl: 2 .  Cholecalciferol (VITAMIN D-3) 5000 UNITS TABS, Take 1 tablet by mouth daily., Disp: , Rfl:  .  hydrochlorothiazide (HYDRODIURIL) 25 MG tablet, Take 0.5 tablets (12.5 mg total) by mouth daily., Disp: 45 tablet, Rfl:  3 .  Multiple Vitamins-Minerals (EYE-VITE PLUS LUTEIN) CAPS, Take 1 capsule by mouth daily. , Disp: , Rfl:  .  Omega-3 Fatty Acids (FISH OIL) 1000 MG CAPS, Take 2 capsules by mouth daily., Disp: , Rfl:  .  rosuvastatin (CRESTOR) 10 MG tablet, Take 0.5 tablets (5 mg total) by mouth daily., Disp: 45 tablet, Rfl: 2 .  sildenafil (REVATIO) 20 MG tablet, TAKE 2-5 TABLETS BY MOUTH ONCE DAILY AS NEEDED FOR SEXUAL ACTIVITY, Disp: 50 tablet, Rfl: 4 Social History   Socioeconomic History  . Marital status: Divorced    Spouse name: Not on file  . Number of children: Not on file  . Years of education: Not on file  . Highest education level: Not on file  Occupational History  . Not on file  Tobacco Use  . Smoking status: Former Smoker    Packs/day: 0.50    Types: Cigarettes    Quit date: 07/02/2010    Years since quitting: 9.9  . Smokeless tobacco: Never Used  . Tobacco comment: "social smoker" per patient.   Vaping Use  . Vaping Use: Never used  Substance and Sexual Activity  . Alcohol use: Yes    Alcohol/week: 4.0 standard drinks    Types: 4 Cans of beer per week  . Drug use: No  . Sexual activity: Not on file  Other Topics Concern  . Not on file  Social History  Narrative  . Not on file   Social Determinants of Health   Financial Resource Strain:   . Difficulty of Paying Living Expenses:   Food Insecurity:   . Worried About Charity fundraiser in the Last Year:   . Arboriculturist in the Last Year:   Transportation Needs:   . Film/video editor (Medical):   Marland Kitchen Lack of Transportation (Non-Medical):   Physical Activity:   . Days of Exercise per Week:   . Minutes of Exercise per Session:   Stress:   . Feeling of Stress :   Social Connections:   . Frequency of Communication with Friends and Family:   . Frequency of Social Gatherings with Friends and Family:   . Attends Religious Services:   . Active Member of Clubs or Organizations:   . Attends Archivist  Meetings:   Marland Kitchen Marital Status:   Intimate Partner Violence:   . Fear of Current or Ex-Partner:   . Emotionally Abused:   Marland Kitchen Physically Abused:   . Sexually Abused:    Family History  Problem Relation Age of Onset  . Hypertension Mother   . Heart attack Father   . Colon cancer Neg Hx   . Colon polyps Neg Hx   . Esophageal cancer Neg Hx   . Rectal cancer Neg Hx   . Stomach cancer Neg Hx     Objective: Office vital signs reviewed. BP 131/85   Pulse 85   Temp 97.7 F (36.5 C) (Temporal)   Ht _0  (1.803 m)   Wt 207 lb (93.9 kg)   SpO2 97%   BMI 28.87 kg/m   Physical Examination:  General: Awake, alert, well nourished, No acute distress HEENT: Normal; sclera white.  Moist mucous membranes.  No carotid bruits Cardio: regular rate and rhythm, S1S2 heard, no murmurs appreciated Pulm: clear to auscultation bilaterally, no wheezes, rhonchi or rales; normal work of breathing on room air Extremities: warm, well perfused, No edema, cyanosis or clubbing; +2 pulses bilaterally MSK: normal gait and station  Lumbar spine: Normal curvature.  He has full active range of motion  Assessment/ Plan: 64 y.o. male   1. Essential hypertension Controlled.  Continue current regimen.  Labs collected - Lipid panel; Future - CMP14+EGFR; Future - amLODipine-valsartan (EXFORGE) 5-320 MG tablet; Take 1 tablet by mouth daily.  Dispense: 90 tablet; Refill: 3 - hydrochlorothiazide (HYDRODIURIL) 25 MG tablet; Take 0.5 tablets (12.5 mg total) by mouth daily.  Dispense: 45 tablet; Refill: 3 - CMP14+EGFR - Lipid panel  2. Pure hypercholesterolemia - Lipid panel; Future - CMP14+EGFR; Future - TSH; Future - rosuvastatin (CRESTOR) 10 MG tablet; Take 0.5 tablets (5 mg total) by mouth daily.  Dispense: 45 tablet; Refill: 3 - TSH - CMP14+EGFR - Lipid panel  3. Aortic atherosclerosis (HCC) - Lipid panel; Future - CMP14+EGFR; Future - CMP14+EGFR - Lipid panel  4. Interstitial pulmonary disease  (HCC) - CBC; Future - CBC  5. Vitamin D deficiency - VITAMIN D 25 Hydroxy (Vit-D Deficiency, Fractures); Future - VITAMIN D 25 Hydroxy (Vit-D Deficiency, Fractures)  6. Lumbar radiculopathy Okay to use medication sparingly.  We discussed the potential dependent nature of Robaxin.  I have a higher suspicion that this is a lumbar radiculopathy given the L5, S1 distribution of numbness and tingling in the foot.  We discussed consideration for imaging plus or minus referral for intervention with orthopedist if symptoms worsen or do not improve.  He voiced good understanding and will  follow up as needed - methocarbamol (ROBAXIN) 500 MG tablet; Take 1 tablet (500 mg total) by mouth every 8 (eight) hours as needed for muscle spasms.  Dispense: 30 tablet; Refill: 0 - etodolac (LODINE) 400 MG tablet; Take 1 tablet (400 mg total) by mouth 2 (two) times daily as needed for moderate pain.  Dispense: 60 tablet; Refill: 2   No orders of the defined types were placed in this encounter.  No orders of the defined types were placed in this encounter.    Norman Norlander, DO [place] [phone]

## 2020-06-02 LAB — CMP14+EGFR
ALT: 24 IU/L (ref 0–44)
AST: 31 IU/L (ref 0–40)
Albumin/Globulin Ratio: 1.8 (ref 1.2–2.2)
Albumin: 4.4 g/dL (ref 3.8–4.8)
Alkaline Phosphatase: 79 IU/L (ref 48–121)
BUN/Creatinine Ratio: 12 (ref 10–24)
BUN: 15 mg/dL (ref 8–27)
Bilirubin Total: 0.6 mg/dL (ref 0.0–1.2)
CO2: 22 mmol/L (ref 20–29)
Calcium: 9.4 mg/dL (ref 8.6–10.2)
Chloride: 102 mmol/L (ref 96–106)
Creatinine, Ser: 1.23 mg/dL (ref 0.76–1.27)
GFR calc Af Amer: 71 mL/min/{1.73_m2} (ref >59)
GFR calc non Af Amer: 62 mL/min/{1.73_m2} (ref >59)
Globulin, Total: 2.4 g/dL (ref 1.5–4.5)
Glucose: 96 mg/dL (ref 65–99)
Potassium: 4.2 mmol/L (ref 3.5–5.2)
Sodium: 139 mmol/L (ref 134–144)
Total Protein: 6.8 g/dL (ref 6.0–8.5)

## 2020-06-02 LAB — CBC
Hematocrit: 44.7 % (ref 37.5–51.0)
Hemoglobin: 15.4 g/dL (ref 13.0–17.7)
MCH: 32.6 pg (ref 26.6–33.0)
MCHC: 34.5 g/dL (ref 31.5–35.7)
MCV: 95 fL (ref 79–97)
Platelets: 290 10*3/uL (ref 150–450)
RBC: 4.72 x10E6/uL (ref 4.14–5.80)
RDW: 11.7 % (ref 11.6–15.4)
WBC: 8.4 10*3/uL (ref 3.4–10.8)

## 2020-06-02 LAB — VITAMIN D 25 HYDROXY (VIT D DEFICIENCY, FRACTURES): Vit D, 25-Hydroxy: 58.4 ng/mL (ref 30.0–100.0)

## 2020-06-02 LAB — LIPID PANEL
Chol/HDL Ratio: 2 ratio (ref 0.0–5.0)
Cholesterol, Total: 117 mg/dL (ref 100–199)
HDL: 59 mg/dL (ref >39)
LDL Chol Calc (NIH): 44 mg/dL (ref 0–99)
Triglycerides: 64 mg/dL (ref 0–149)
VLDL Cholesterol Cal: 14 mg/dL (ref 5–40)

## 2020-06-02 LAB — TSH: TSH: 1.78 u[IU]/mL (ref 0.450–4.500)

## 2020-07-06 ENCOUNTER — Other Ambulatory Visit: Payer: Self-pay | Admitting: Family Medicine

## 2020-09-28 DIAGNOSIS — D229 Melanocytic nevi, unspecified: Secondary | ICD-10-CM | POA: Diagnosis not present

## 2020-09-28 DIAGNOSIS — L820 Inflamed seborrheic keratosis: Secondary | ICD-10-CM | POA: Diagnosis not present

## 2020-09-28 DIAGNOSIS — L814 Other melanin hyperpigmentation: Secondary | ICD-10-CM | POA: Diagnosis not present

## 2020-09-28 DIAGNOSIS — L719 Rosacea, unspecified: Secondary | ICD-10-CM | POA: Diagnosis not present

## 2021-04-19 ENCOUNTER — Other Ambulatory Visit: Payer: Self-pay | Admitting: Emergency Medicine

## 2021-04-19 DIAGNOSIS — J849 Interstitial pulmonary disease, unspecified: Secondary | ICD-10-CM

## 2021-05-21 ENCOUNTER — Other Ambulatory Visit: Payer: Self-pay | Admitting: Family Medicine

## 2021-05-21 DIAGNOSIS — I10 Essential (primary) hypertension: Secondary | ICD-10-CM

## 2021-05-26 ENCOUNTER — Other Ambulatory Visit: Payer: Self-pay

## 2021-05-26 ENCOUNTER — Other Ambulatory Visit: Payer: Self-pay | Admitting: Family Medicine

## 2021-05-26 ENCOUNTER — Ambulatory Visit (INDEPENDENT_AMBULATORY_CARE_PROVIDER_SITE_OTHER): Payer: Medicare Other | Admitting: Family Medicine

## 2021-05-26 ENCOUNTER — Encounter: Payer: Self-pay | Admitting: Family Medicine

## 2021-05-26 VITALS — BP 138/86 | HR 70 | Temp 98.2°F | Ht 71.0 in | Wt 200.4 lb

## 2021-05-26 DIAGNOSIS — M5416 Radiculopathy, lumbar region: Secondary | ICD-10-CM

## 2021-05-26 DIAGNOSIS — E7800 Pure hypercholesterolemia, unspecified: Secondary | ICD-10-CM | POA: Diagnosis not present

## 2021-05-26 DIAGNOSIS — I10 Essential (primary) hypertension: Secondary | ICD-10-CM

## 2021-05-26 DIAGNOSIS — J849 Interstitial pulmonary disease, unspecified: Secondary | ICD-10-CM | POA: Diagnosis not present

## 2021-05-26 DIAGNOSIS — N400 Enlarged prostate without lower urinary tract symptoms: Secondary | ICD-10-CM

## 2021-05-26 DIAGNOSIS — Z23 Encounter for immunization: Secondary | ICD-10-CM | POA: Diagnosis not present

## 2021-05-26 DIAGNOSIS — Z0001 Encounter for general adult medical examination with abnormal findings: Secondary | ICD-10-CM

## 2021-05-26 DIAGNOSIS — I700 Atherosclerosis of aorta: Secondary | ICD-10-CM | POA: Diagnosis not present

## 2021-05-26 DIAGNOSIS — E559 Vitamin D deficiency, unspecified: Secondary | ICD-10-CM

## 2021-05-26 DIAGNOSIS — Z0000 Encounter for general adult medical examination without abnormal findings: Secondary | ICD-10-CM

## 2021-05-26 MED ORDER — HYDROCHLOROTHIAZIDE 25 MG PO TABS: 12.5000 mg | ORAL_TABLET | Freq: Every day | ORAL | 3 refills | Status: DC

## 2021-05-26 MED ORDER — ROSUVASTATIN CALCIUM 10 MG PO TABS: 5.0000 mg | ORAL_TABLET | Freq: Every day | ORAL | 3 refills | Status: DC

## 2021-05-26 NOTE — Patient Instructions (Signed)
You had labs performed today.  You will be contacted with the results of the labs once they are available, usually in the next 3 business days for routine lab work.  If you have an active my chart account, they will be released to your MyChart.  If you prefer to have these labs released to you via telephone, please let us know.  If you had a pap smear or biopsy performed, expect to be contacted in about 7-10 days.  Preventive Care 66 Years and Older, Male Preventive care refers to lifestyle choices and visits with your health care provider that can promote health and wellness. This includes: A yearly physical exam. This is also called an annual wellness visit. Regular dental and eye exams. Immunizations. Screening for certain conditions. Healthy lifestyle choices, such as: Eating a healthy diet. Getting regular exercise. Not using drugs or products that contain nicotine and tobacco. Limiting alcohol use. What can I expect for my preventive care visit? Physical exam Your health care provider will check your: Height and weight. These may be used to calculate your BMI (body mass index). BMI is a measurement that tells if you are at a healthy weight. Heart rate and blood pressure. Body temperature. Skin for abnormal spots. Counseling Your health care provider may ask you questions about your: Past medical problems. Family's medical history. Alcohol, tobacco, and drug use. Emotional well-being. Home life and relationship well-being. Sexual activity. Diet, exercise, and sleep habits. History of falls. Memory and ability to understand (cognition). Work and work Statistician. Access to firearms. What immunizations do I need?  Vaccines are usually given at various ages, according to a schedule. Your health care provider will recommend vaccines for you based on your age, medicalhistory, and lifestyle or other factors, such as travel or where you work. What tests do I need? Blood  tests Lipid and cholesterol levels. These may be checked every 5 years, or more often depending on your overall health. Hepatitis C test. Hepatitis B test. Screening Lung cancer screening. You may have this screening every year starting at age 31 if you have a 30-pack-year history of smoking and currently smoke or have quit within the past 15 years. Colorectal cancer screening. All adults should have this screening starting at age 56 and continuing until age 62. Your health care provider may recommend screening at age 75 if you are at increased risk. You will have tests every 1-10 years, depending on your results and the type of screening test. Prostate cancer screening. Recommendations will vary depending on your family history and other risks. Genital exam to check for testicular cancer or hernias. Diabetes screening. This is done by checking your blood sugar (glucose) after you have not eaten for a while (fasting). You may have this done every 1-3 years. Abdominal aortic aneurysm (AAA) screening. You may need this if you are a current or former smoker. STD (sexually transmitted disease) testing, if you are at risk. Follow these instructions at home: Eating and drinking  Eat a diet that includes fresh fruits and vegetables, whole grains, lean protein, and low-fat dairy products. Limit your intake of foods with high amounts of sugar, saturated fats, and salt. Take vitamin and mineral supplements as recommended by your health care provider. Do not drink alcohol if your health care provider tells you not to drink. If you drink alcohol: Limit how much you have to 0-2 drinks a day. Be aware of how much alcohol is in your drink. In the [place] one drink  equals one 12 oz bottle of beer (355 mL), one 5 oz glass of wine (148 mL), or one 1 oz glass of hard liquor (44 mL).  Lifestyle Take daily care of your teeth and gums. Brush your teeth every morning and night with fluoride toothpaste. Floss one  time each day. Stay active. Exercise for at least 30 minutes 5 or more days each week. Do not use any products that contain nicotine or tobacco, such as cigarettes, e-cigarettes, and chewing tobacco. If you need help quitting, ask your health care provider. Do not use drugs. If you are sexually active, practice safe sex. Use a condom or other form of protection to prevent STIs (sexually transmitted infections). Talk with your health care provider about taking a low-dose aspirin or statin. Find healthy ways to cope with stress, such as: Meditation, yoga, or listening to music. Journaling. Talking to a trusted person. Spending time with friends and family. Safety Always wear your seat belt while driving or riding in a vehicle. Do not drive: If you have been drinking alcohol. Do not ride with someone who has been drinking. When you are tired or distracted. While texting. Wear a helmet and other protective equipment during sports activities. If you have firearms in your house, make sure you follow all gun safety procedures. What's next? Visit your health care provider once a year for an annual wellness visit. Ask your health care provider how often you should have your eyes and teeth checked. Stay up to date on all vaccines. This information is not intended to replace advice given to you by your health care provider. Make sure you discuss any questions you have with your healthcare provider. Document Revised: 09/01/2019 Document Reviewed: 11/27/2018 Elsevier Patient Education  2022 [employer].

## 2021-05-26 NOTE — Progress Notes (Signed)
Norman Briggs is a 65 y.o. male presents to office today for annual physical exam examination.    Concerns today include: 1.  Buttock pain Patient with about a 2-year history of buttock pain that radiates down the right lower extremity and causes some decrease sensation in the toes of the right foot.  This initially started severe back pain 2 years ago at which point he sought care in the urgent care and he was treated appropriately.  At our last visit he was still having some symptomology and his medications were renewed.  Over the last couple of months symptoms do seem to be getting a little bit better now that he is on a regular walking program but they are still present he just wanted to mention this.  He does not wish to pursue imaging nor referral yet but would rather see how things go.  Not currently taking any medication for current symptoms  2.  Hyperlipidemia with hypertension Patient is compliant with Crestor 5 mg daily.  No chest pain, abdominal pain.  He remains physically active and tries to maintain a healthy lifestyle.  He is wondering if he can come off of any of his medications since he has had some improvement in lifestyle.  He does admit that his father had a heart attack in his 43s despite being a "healthy 65 year old".  Blood pressure at home running 120s over 70s typically  3.  BPH without LUTS Patient continues to follow-up with urology annually.  Would like PSA collected and sent to their office.  Diet: Balanced, Exercise: Structured Last colonoscopy: Up-to-date Refills needed today: All Immunizations needed: We will proceed with pneumococcal today.  Would like to hold off on shingles Immunization History  Administered Date(s) Administered   Influenza, Quadrivalent, Recombinant, Inj, Pf 11/05/2019   Influenza,inj,Quad PF,6+ Mos 01/07/2014, 09/19/2015, 10/15/2016, 11/12/2017, 10/21/2018   Influenza,inj,quad, With Preservative 11/25/2014   Moderna SARS-COV2 Booster  Vaccination 11/14/2020   Moderna Sars-Covid-2 Vaccination 03/02/2020, 03/30/2020   Pneumococcal Conjugate-13 05/26/2021   Td 06/01/2020   Tdap 03/07/2010     Past Medical History:  Diagnosis Date   Allergy    Aortic atherosclerosis ([place]) 04/23/2018   Per CT scan   Arthritis    shoulder-right   History of elevated PSA 07/02/2013   Hyperlipidemia    Hypertension    Vitamin D deficiency    Social History   Socioeconomic History   Marital status: Divorced    Spouse name: Not on file   Number of children: Not on file   Years of education: Not on file   Highest education level: Not on file  Occupational History   Not on file  Tobacco Use   Smoking status: Former    Packs/day: 0.50    Pack years: 0.00    Types: Cigarettes    Quit date: 07/02/2010    Years since quitting: 10.9   Smokeless tobacco: Never   Tobacco comments:    "social smoker" per patient.   Vaping Use   Vaping Use: Never used  Substance and Sexual Activity   Alcohol use: Yes    Alcohol/week: 4.0 standard drinks    Types: 4 Cans of beer per week    Comment: occ   Drug use: No   Sexual activity: Not on file  Other Topics Concern   Not on file  Social History Narrative   Not on file   Social Determinants of Health   Financial Resource Strain: Not on file  Food Insecurity:  Not on file  Transportation Needs: Not on file  Physical Activity: Not on file  Stress: Not on file  Social Connections: Not on file  Intimate Partner Violence: Not on file   Past Surgical History:  Procedure Laterality Date   APPENDECTOMY  12/17/1974   COLONOSCOPY  2006 + 02/2016   diverticulosis - Carlean Purl   KNEE SURGERY Right    x 3 surgeries   lefgt wrist  Left    reapair- glass injury   PILONIDAL CYST EXCISION     SHOULDER SURGERY Right 05/31/2017   Family History  Problem Relation Age of Onset   Hypertension Mother    Heart attack Father    Colon cancer Neg Hx    Colon polyps Neg Hx    Esophageal cancer Neg Hx     Rectal cancer Neg Hx    Stomach cancer Neg Hx     Current Outpatient Medications:    amLODipine-valsartan (EXFORGE) 5-320 MG tablet, TAKE 1 TABLET BY MOUTH EVERY DAY, Disp: 90 tablet, Rfl: 0   aspirin EC 81 MG tablet, Take 81 mg by mouth daily., Disp: , Rfl:    Azelastine HCl 137 MCG/SPRAY SOLN, PLACE 1 SPRAY INTO BOTH NOSTRILS AT BEDTIME. USE IN EACH NOSTRIL AS DIRECTED, Disp: 90 mL, Rfl: 1   Cholecalciferol (VITAMIN D-3) 5000 UNITS TABS, Take 1 tablet by mouth daily., Disp: , Rfl:    etodolac (LODINE) 400 MG tablet, Take 1 tablet (400 mg total) by mouth 2 (two) times daily as needed for moderate pain., Disp: 60 tablet, Rfl: 2   hydrochlorothiazide (HYDRODIURIL) 25 MG tablet, Take 0.5 tablets (12.5 mg total) by mouth daily., Disp: 45 tablet, Rfl: 3   methocarbamol (ROBAXIN) 500 MG tablet, Take 1 tablet (500 mg total) by mouth every 8 (eight) hours as needed for muscle spasms., Disp: 30 tablet, Rfl: 0   Multiple Vitamins-Minerals (EYE-VITE PLUS LUTEIN) CAPS, Take 1 capsule by mouth daily. , Disp: , Rfl:    Omega-3 Fatty Acids (FISH OIL) 1000 MG CAPS, Take 2 capsules by mouth daily., Disp: , Rfl:    rosuvastatin (CRESTOR) 10 MG tablet, Take 0.5 tablets (5 mg total) by mouth daily., Disp: 45 tablet, Rfl: 3   sildenafil (REVATIO) 20 MG tablet, TAKE 2-5 TABLETS BY MOUTH ONCE DAILY AS NEEDED FOR SEXUAL ACTIVITY, Disp: 50 tablet, Rfl: 4  Allergies  Allergen Reactions   Lipitor [Atorvastatin]     Joint pain and myalgias   Anaprox [Naproxen Sodium] Rash   Lisinopril Cough     ROS: Review of Systems Pertinent items noted in HPI and remainder of comprehensive ROS otherwise negative.    Physical exam BP 138/86   Pulse 70   Temp 98.2 F (36.8 C) (Temporal)   Ht '5\' 11"'  (1.803 m)   Wt 200 lb 6.4 oz (90.9 kg)   SpO2 99%   BMI 27.95 kg/m  General appearance: alert, cooperative, appears stated age, and no distress Head: Normocephalic, without obvious abnormality, atraumatic Eyes: negative  findings: lids and lashes normal, conjunctivae and sclerae normal, corneas clear, and pupils equal, round, reactive to light and accomodation Ears: normal TM's and external ear canals both ears Nose: Nares normal. Septum midline. Mucosa normal. No drainage or sinus tenderness. Throat: lips, mucosa, and tongue normal; teeth and gums normal Neck: no adenopathy, no carotid bruit, supple, symmetrical, trachea midline, and thyroid not enlarged, symmetric, no tenderness/mass/nodules Back: symmetric, no curvature. ROM normal. No CVA tenderness. Lungs: clear to auscultation bilaterally Chest wall: no tenderness Heart:  regular rate and rhythm, S1, S2 normal, no murmur, click, rub or gallop Abdomen: soft, non-tender; bowel sounds normal; no masses,  no organomegaly Extremities: extremities normal, atraumatic, no cyanosis or edema Pulses: 2+ and symmetric Skin: Skin color, texture, turgor normal. No rashes or lesions Lymph nodes: Cervical, supraclavicular, and axillary nodes normal. Neurologic: Alert and oriented X 3, normal strength and tone. Normal symmetric reflexes. Normal coordination and gait Psych: Mood stable, speech normal, affect appropriate Depression screen [employer] 2/9 05/26/2021 06/01/2020 08/12/2019  Decreased Interest 0 0 0  Down, Depressed, Hopeless 0 0 0  PHQ - 2 Score 0 0 0  Altered sleeping 0 0 0  Tired, decreased energy 0 0 0  Change in appetite 0 0 0  Feeling bad or failure about yourself  0 0 0  Trouble concentrating 0 0 0  Moving slowly or fidgety/restless 0 - 0  Suicidal thoughts 0 - 0  PHQ-9 Score 0 0 0  Difficult doing work/chores Not difficult at all - -    Assessment/ Plan: Feliz Beam here for annual physical exam.   Annual physical exam  Essential hypertension - Plan: CMP14+EGFR, hydrochlorothiazide (HYDRODIURIL) 25 MG tablet  Pure hypercholesterolemia - Plan: CMP14+EGFR, Lipid Panel, TSH, rosuvastatin (CRESTOR) 10 MG tablet  Aortic atherosclerosis (HCC) - Plan:  CMP14+EGFR, Lipid Panel, TSH  Vitamin D deficiency - Plan: VITAMIN D 25 Hydroxy (Vit-D Deficiency, Fractures)  Lumbar radiculopathy  Enlarged prostate without lower urinary tract symptoms (luts) - Plan: CBC, PSA  Blood pressure under good control.  We discussed that he could trial off of the hydrochlorothiazide but otherwise would not really favor coming off of the Exforge given normotension.  I would suspect that he would have a rapid rise in blood pressure resulting in uncontrolled hypertension and therefore increasing his cardiovascular risk.  He was good understanding will simply continue current regimen with both his blood pressure and cholesterol medicines  Fasting labs were obtained today.  Vitamin D level collected given history of deficiency  Continues to have a lumbar radiculopathy but surprisingly seems to be improving over the last couple of months.  We will contact me if he would like to proceed with referral and/or MRI  Continues to follow-up with urology.  PSA ordered given history of enlarged prostate  Counseled on healthy lifestyle choices, including diet (rich in fruits, vegetables and lean meats and low in salt and simple carbohydrates) and exercise (at least 30 minutes of moderate physical activity daily).  Patient to follow up in 1 year for annual exam or sooner if needed.  Heavenlee Maiorana M. Lajuana Ripple, DO

## 2021-05-27 LAB — CMP14+EGFR
ALT: 19 IU/L (ref 0–44)
AST: 24 IU/L (ref 0–40)
Albumin/Globulin Ratio: 2 (ref 1.2–2.2)
Albumin: 4.8 g/dL (ref 3.8–4.8)
Alkaline Phosphatase: 82 IU/L (ref 44–121)
BUN/Creatinine Ratio: 18 (ref 10–24)
BUN: 24 mg/dL (ref 8–27)
Bilirubin Total: 0.6 mg/dL (ref 0.0–1.2)
CO2: 22 mmol/L (ref 20–29)
Calcium: 9.8 mg/dL (ref 8.6–10.2)
Chloride: 99 mmol/L (ref 96–106)
Creatinine, Ser: 1.34 mg/dL — ABNORMAL HIGH (ref 0.76–1.27)
Globulin, Total: 2.4 g/dL (ref 1.5–4.5)
Glucose: 117 mg/dL — ABNORMAL HIGH (ref 65–99)
Potassium: 4.5 mmol/L (ref 3.5–5.2)
Sodium: 137 mmol/L (ref 134–144)
Total Protein: 7.2 g/dL (ref 6.0–8.5)
eGFR: 59 mL/min/{1.73_m2} — ABNORMAL LOW (ref >59)

## 2021-05-27 LAB — CBC
Hematocrit: 41.1 % (ref 37.5–51.0)
Hemoglobin: 13.3 g/dL (ref 13.0–17.7)
MCH: 26.3 pg — ABNORMAL LOW (ref 26.6–33.0)
MCHC: 32.4 g/dL (ref 31.5–35.7)
MCV: 81 fL (ref 79–97)
Platelets: 250 10*3/uL (ref 150–450)
RBC: 5.06 x10E6/uL (ref 4.14–5.80)
RDW: 14.8 % (ref 11.6–15.4)
WBC: 9.1 10*3/uL (ref 3.4–10.8)

## 2021-05-27 LAB — LIPID PANEL
Chol/HDL Ratio: 2.5 ratio (ref 0.0–5.0)
Cholesterol, Total: 145 mg/dL (ref 100–199)
HDL: 59 mg/dL (ref >39)
LDL Chol Calc (NIH): 72 mg/dL (ref 0–99)
Triglycerides: 69 mg/dL (ref 0–149)
VLDL Cholesterol Cal: 14 mg/dL (ref 5–40)

## 2021-05-27 LAB — TSH: TSH: 1.87 u[IU]/mL (ref 0.450–4.500)

## 2021-05-27 LAB — PSA: Prostate Specific Ag, Serum: 1.8 ng/mL (ref 0.0–4.0)

## 2021-05-27 LAB — VITAMIN D 25 HYDROXY (VIT D DEFICIENCY, FRACTURES): Vit D, 25-Hydroxy: 77.3 ng/mL (ref 30.0–100.0)

## 2021-05-30 ENCOUNTER — Ambulatory Visit (INDEPENDENT_AMBULATORY_CARE_PROVIDER_SITE_OTHER)
Admission: RE | Admit: 2021-05-30 | Discharge: 2021-05-30 | Disposition: A | Discharge status: Home / Self Care | Payer: Medicare Other | Source: Ambulatory Visit | Attending: Emergency Medicine | Admitting: Emergency Medicine

## 2021-05-30 ENCOUNTER — Other Ambulatory Visit: Payer: Self-pay

## 2021-05-30 DIAGNOSIS — J849 Interstitial pulmonary disease, unspecified: Secondary | ICD-10-CM | POA: Diagnosis not present

## 2021-05-30 DIAGNOSIS — I358 Other nonrheumatic aortic valve disorders: Secondary | ICD-10-CM | POA: Diagnosis not present

## 2021-05-30 DIAGNOSIS — I2510 Atherosclerotic heart disease of native coronary artery without angina pectoris: Secondary | ICD-10-CM | POA: Diagnosis not present

## 2021-05-30 DIAGNOSIS — J84112 Idiopathic pulmonary fibrosis: Secondary | ICD-10-CM | POA: Diagnosis not present

## 2021-05-30 DIAGNOSIS — J432 Centrilobular emphysema: Secondary | ICD-10-CM | POA: Diagnosis not present

## 2021-08-07 ENCOUNTER — Other Ambulatory Visit: Payer: Self-pay | Admitting: Family Medicine

## 2021-08-17 ENCOUNTER — Other Ambulatory Visit: Payer: Self-pay | Admitting: Family Medicine

## 2021-08-17 DIAGNOSIS — I10 Essential (primary) hypertension: Secondary | ICD-10-CM

## 2021-08-17 NOTE — Telephone Encounter (Signed)
OV 05/26/21 rtc 1 yr

## 2021-09-28 DIAGNOSIS — Z1283 Encounter for screening for malignant neoplasm of skin: Secondary | ICD-10-CM | POA: Diagnosis not present

## 2021-09-28 DIAGNOSIS — L570 Actinic keratosis: Secondary | ICD-10-CM | POA: Diagnosis not present

## 2021-09-28 DIAGNOSIS — D1801 Hemangioma of skin and subcutaneous tissue: Secondary | ICD-10-CM | POA: Diagnosis not present

## 2021-09-28 DIAGNOSIS — L821 Other seborrheic keratosis: Secondary | ICD-10-CM | POA: Diagnosis not present

## 2021-09-28 DIAGNOSIS — L578 Other skin changes due to chronic exposure to nonionizing radiation: Secondary | ICD-10-CM | POA: Diagnosis not present

## 2021-09-28 DIAGNOSIS — L812 Freckles: Secondary | ICD-10-CM | POA: Diagnosis not present

## 2021-09-28 DIAGNOSIS — L719 Rosacea, unspecified: Secondary | ICD-10-CM | POA: Diagnosis not present

## 2021-09-28 DIAGNOSIS — L814 Other melanin hyperpigmentation: Secondary | ICD-10-CM | POA: Diagnosis not present

## 2022-04-09 DIAGNOSIS — H353131 Nonexudative age-related macular degeneration, bilateral, early dry stage: Secondary | ICD-10-CM | POA: Diagnosis not present

## 2022-04-09 DIAGNOSIS — D3142 Benign neoplasm of left ciliary body: Secondary | ICD-10-CM | POA: Diagnosis not present

## 2022-04-09 DIAGNOSIS — H35033 Hypertensive retinopathy, bilateral: Secondary | ICD-10-CM | POA: Diagnosis not present

## 2022-05-19 ENCOUNTER — Other Ambulatory Visit: Payer: Self-pay | Admitting: Family Medicine

## 2022-05-19 DIAGNOSIS — I10 Essential (primary) hypertension: Secondary | ICD-10-CM

## 2022-05-27 ENCOUNTER — Other Ambulatory Visit: Payer: Self-pay | Admitting: Family Medicine

## 2022-05-27 DIAGNOSIS — I10 Essential (primary) hypertension: Secondary | ICD-10-CM

## 2022-05-28 ENCOUNTER — Ambulatory Visit (INDEPENDENT_AMBULATORY_CARE_PROVIDER_SITE_OTHER): Payer: Medicare Other | Admitting: Family Medicine

## 2022-05-28 ENCOUNTER — Encounter: Payer: Self-pay | Admitting: Family Medicine

## 2022-05-28 VITALS — BP 137/79 | HR 57 | Temp 97.6°F | Ht 71.0 in | Wt 205.6 lb

## 2022-05-28 DIAGNOSIS — G6289 Other specified polyneuropathies: Secondary | ICD-10-CM | POA: Diagnosis not present

## 2022-05-28 DIAGNOSIS — I10 Essential (primary) hypertension: Secondary | ICD-10-CM

## 2022-05-28 DIAGNOSIS — I700 Atherosclerosis of aorta: Secondary | ICD-10-CM | POA: Diagnosis not present

## 2022-05-28 DIAGNOSIS — R011 Cardiac murmur, unspecified: Secondary | ICD-10-CM | POA: Diagnosis not present

## 2022-05-28 DIAGNOSIS — Z0001 Encounter for general adult medical examination with abnormal findings: Secondary | ICD-10-CM | POA: Diagnosis not present

## 2022-05-28 DIAGNOSIS — E7800 Pure hypercholesterolemia, unspecified: Secondary | ICD-10-CM | POA: Diagnosis not present

## 2022-05-28 DIAGNOSIS — R739 Hyperglycemia, unspecified: Secondary | ICD-10-CM | POA: Diagnosis not present

## 2022-05-28 DIAGNOSIS — J849 Interstitial pulmonary disease, unspecified: Secondary | ICD-10-CM | POA: Diagnosis not present

## 2022-05-28 DIAGNOSIS — Z0000 Encounter for general adult medical examination without abnormal findings: Secondary | ICD-10-CM

## 2022-05-28 DIAGNOSIS — Z23 Encounter for immunization: Secondary | ICD-10-CM

## 2022-05-28 LAB — BAYER DCA HB A1C WAIVED: HB A1C (BAYER DCA - WAIVED): 5.5 % (ref 4.8–5.6)

## 2022-05-28 MED ORDER — AMLODIPINE BESYLATE-VALSARTAN 5-320 MG PO TABS: 1.0000 | ORAL_TABLET | Freq: Every day | ORAL | 3 refills | Status: DC

## 2022-05-28 MED ORDER — ROSUVASTATIN CALCIUM 10 MG PO TABS: 5.0000 mg | ORAL_TABLET | Freq: Every day | ORAL | 3 refills | Status: DC

## 2022-05-28 MED ORDER — AZELASTINE HCL 137 MCG/SPRAY NA SOLN: NASAL | 3 refills | Status: DC

## 2022-05-28 MED ORDER — HYDROCHLOROTHIAZIDE 25 MG PO TABS: 12.5000 mg | ORAL_TABLET | Freq: Every day | ORAL | 3 refills | Status: DC

## 2022-05-28 NOTE — Progress Notes (Signed)
Norman Briggs is a 66 y.o. male presents to office today for annual physical exam examination.    Concerns today include: 1.  Neuropathy Continue to have decreased/dull sensation on bilateral feet.  He points to the area between the toes and the ball of his foot.  It seems to be worse on the right than the left but he notes that sometimes this causes him to have some gait instability.  He otherwise walks several miles per day and has done so for years.  He has had no change in exercise tolerance.  Diet is fairly balanced with the exception of yesterday when he ate some sweets at a family reunion.  2.  Hypertension with hyperlipidemia Compliant with all meds.  No reports of chest pain, shortness of breath, dizziness, edema, falls.  Diet: As above, Exercise: 20+ miles per week walked.  Plays golf Last eye exam: Up-to-date Last dental exam: Q. 9 months Last colonoscopy: Up-to-date Refills needed today: All Immunizations needed: Shingles and pneumococcal Immunization History  Administered Date(s) Administered   Influenza, High Dose Seasonal PF 09/25/2021   Influenza, Quadrivalent, Recombinant, Inj, Pf 11/05/2019   Influenza,inj,Quad PF,6+ Mos 01/07/2014, 09/19/2015, 10/15/2016, 11/12/2017, 10/21/2018   Influenza,inj,quad, With Preservative 11/25/2014   Moderna SARS-COV2 Booster Vaccination 11/14/2020   Moderna Sars-Covid-2 Vaccination 03/02/2020, 03/30/2020   Pneumococcal Conjugate-13 05/26/2021   Td 06/01/2020   Tdap 03/07/2010     Past Medical History:  Diagnosis Date   Allergy    Aortic atherosclerosis ([place]) 04/23/2018   Per CT scan   Arthritis    shoulder-right   History of elevated PSA 07/02/2013   Hyperlipidemia    Hypertension    Vitamin D deficiency    Social History   Socioeconomic History   Marital status: Divorced    Spouse name: Not on file   Number of children: Not on file   Years of education: Not on file   Highest education level: Not on file  Occupational  History   Not on file  Tobacco Use   Smoking status: Former    Packs/day: 0.50    Types: Cigarettes    Quit date: 07/02/2010    Years since quitting: 11.9   Smokeless tobacco: Never   Tobacco comments:    "social smoker" per patient.   Vaping Use   Vaping Use: Never used  Substance and Sexual Activity   Alcohol use: Yes    Alcohol/week: 4.0 standard drinks of alcohol    Types: 4 Cans of beer per week    Comment: occ   Drug use: No   Sexual activity: Not on file  Other Topics Concern   Not on file  Social History Narrative   Not on file   Social Determinants of Health   Financial Resource Strain: Not on file  Food Insecurity: Not on file  Transportation Needs: Not on file  Physical Activity: Not on file  Stress: Not on file  Social Connections: Not on file  Intimate Partner Violence: Not on file   Past Surgical History:  Procedure Laterality Date   APPENDECTOMY  12/17/1974   COLONOSCOPY  2006 + 02/2016   diverticulosis - Carlean Purl   KNEE SURGERY Right    x 3 surgeries   lefgt wrist  Left    reapair- glass injury   PILONIDAL CYST EXCISION     SHOULDER SURGERY Right 05/31/2017   Family History  Problem Relation Age of Onset   Hypertension Mother    Heart attack Father  Colon cancer Neg Hx    Colon polyps Neg Hx    Esophageal cancer Neg Hx    Rectal cancer Neg Hx    Stomach cancer Neg Hx     Current Outpatient Medications:    amLODipine-valsartan (EXFORGE) 5-320 MG tablet, TAKE 1 TABLET BY MOUTH EVERY DAY, Disp: 90 tablet, Rfl: 0   aspirin EC 81 MG tablet, Take 81 mg by mouth daily., Disp: , Rfl:    Azelastine HCl 137 MCG/SPRAY SOLN, PLACE 1 SPRAY INTO BOTH NOSTRILS AT BEDTIME. USE IN EACH NOSTRIL AS DIRECTED, Disp: 90 mL, Rfl: 1   Cholecalciferol (VITAMIN D-3) 5000 UNITS TABS, Take 1 tablet by mouth daily., Disp: , Rfl:    hydrochlorothiazide (HYDRODIURIL) 25 MG tablet, Take 0.5 tablets (12.5 mg total) by mouth daily., Disp: 45 tablet, Rfl: 3   Multiple  Vitamins-Minerals (EYE-VITE PLUS LUTEIN) CAPS, Take 1 capsule by mouth daily. , Disp: , Rfl:    Omega-3 Fatty Acids (FISH OIL) 1000 MG CAPS, Take 2 capsules by mouth daily., Disp: , Rfl:    rosuvastatin (CRESTOR) 10 MG tablet, Take 0.5 tablets (5 mg total) by mouth daily., Disp: 45 tablet, Rfl: 3   sildenafil (REVATIO) 20 MG tablet, TAKE 2-5 TABLETS BY MOUTH ONCE DAILY AS NEEDED FOR SEXUAL ACTIVITY, Disp: 50 tablet, Rfl: 4  Allergies  Allergen Reactions   Lipitor [Atorvastatin]     Joint pain and myalgias   Anaprox [Naproxen Sodium] Rash   Lisinopril Cough     ROS: Review of Systems Pertinent items noted in HPI and remainder of comprehensive ROS otherwise negative.    Physical exam BP 137/79   Pulse (!) 57   Temp 97.6 F (36.4 C)   Ht _0  (1.803 m)   Wt 205 lb 9.6 oz (93.3 kg)   SpO2 99%   BMI 28.68 kg/m  General appearance: alert, cooperative, appears stated age, and no distress Head: Normocephalic, without obvious abnormality, atraumatic Eyes: negative findings: lids and lashes normal, conjunctivae and sclerae normal, corneas clear, and pupils equal, round, reactive to light and accomodation Ears: normal TM's and external ear canals both ears Nose: Nares normal. Septum midline. Mucosa normal. No drainage or sinus tenderness. Throat: lips, mucosa, and tongue normal; teeth and gums normal Neck: no adenopathy, no carotid bruit, supple, symmetrical, trachea midline, and thyroid not enlarged, symmetric, no tenderness/mass/nodules Back: symmetric, no curvature. ROM normal. No CVA tenderness. Lungs: clear to auscultation bilaterally Chest wall: no tenderness Heart:  Soft systolic ejection murmur noted along the right sternal border.  No radiation to the carotids.  S1-S2 heard.  Regular rate and rhythm Abdomen: soft, non-tender; bowel sounds normal; no masses,  no organomegaly Extremities: extremities normal, atraumatic, no cyanosis or edema Pulses: 2+ and symmetric Skin: Skin  color, texture, turgor normal. No rashes or lesions Lymph nodes: Cervical, supraclavicular, and axillary nodes normal. Neurologic: Grossly normal Psych: Mood stable, speech normal, affect appropriate    05/28/2022   10:04 AM 05/26/2021   10:09 AM 06/01/2020    2:37 PM  Depression screen PHQ 2/9  Decreased Interest 0 0 0  Down, Depressed, Hopeless 0 0 0  PHQ - 2 Score 0 0 0  Altered sleeping  0 0  Tired, decreased energy  0 0  Change in appetite  0 0  Feeling bad or failure about yourself   0 0  Trouble concentrating  0 0  Moving slowly or fidgety/restless  0   Suicidal thoughts  0   PHQ-9 Score  0 0  Difficult doing work/chores  Not difficult at all       05/28/2022   10:04 AM 05/26/2021   10:09 AM  GAD 7 : Generalized Anxiety Score  Nervous, Anxious, on Edge 0 0  Control/stop worrying 0 0  Worry too much - different things 0 0  Trouble relaxing 0 0  Restless 0 0  Easily annoyed or irritable 0 0  Afraid - awful might happen 0 0  Total GAD 7 Score 0 0  Anxiety Difficulty Not difficult at all Not difficult at all   Assessment/ Plan: Feliz Beam here for annual physical exam.   Annual physical exam - Plan: Pneumococcal conjugate vaccine 20-valent (Prevnar 20)  Essential hypertension - Plan: amLODipine-valsartan (EXFORGE) 5-320 MG tablet, hydrochlorothiazide (HYDRODIURIL) 25 MG tablet, CMP14+EGFR  Pure hypercholesterolemia - Plan: rosuvastatin (CRESTOR) 10 MG tablet, CMP14+EGFR, Lipid Panel, TSH  Aortic atherosclerosis (HCC) - Plan: rosuvastatin (CRESTOR) 10 MG tablet, CMP14+EGFR, Lipid Panel  Interstitial pulmonary disease (HCC) - Plan: CBC  Elevated serum glucose - Plan: Bayer DCA Hb A1c Waived  Other polyneuropathy - Plan: Vitamin B12  Newly recognized heart murmur - Plan: ECHOCARDIOGRAM COMPLETE  Pneumococcal vaccination administered.  Shingles #1 vaccination administered.  Blood pressure well controlled upon recheck.  No changes  Fasting labs collected.   Crestor renewed.  Continue to follow-up with pulmonology with regards to interstitial pulmonary disease.  Patient is not symptomatic from that standpoint and exercises regularly  A1c did not demonstrate any diabetes  B12 collected given reports of polyneuropathy in the feet.  May need to consider MRI but patient would not be amenable to any surgical intervention  He did have a very soft systolic murmur noted at the right sternal border today.  For this reason echocardiogram has been ordered to further evaluate  Counseled on healthy lifestyle choices, including diet (rich in fruits, vegetables and lean meats and low in salt and simple carbohydrates) and exercise (at least 30 minutes of moderate physical activity daily).  Patient to follow up in 1 year for annual exam or sooner if needed.  Cataleya Cristina M. Lajuana Ripple, DO

## 2022-05-28 NOTE — Patient Instructions (Signed)
You had labs performed today.  You will be contacted with the results of the labs once they are available, usually in the next 3 business days for routine lab work.  If you have an active my chart account, they will be released to your MyChart.  If you prefer to have these labs released to you via telephone, please let us know.   Preventive Care 65 Years and Older, Male Preventive care refers to lifestyle choices and visits with your health care provider that can promote health and wellness. Preventive care visits are also called wellness exams. What can I expect for my preventive care visit? Counseling During your preventive care visit, your health care provider may ask about your: Medical history, including: Past medical problems. Family medical history. History of falls. Current health, including: Emotional well-being. Home life and relationship well-being. Sexual activity. Memory and ability to understand (cognition). Lifestyle, including: Alcohol, nicotine or tobacco, and drug use. Access to firearms. Diet, exercise, and sleep habits. Work and work environment. Sunscreen use. Safety issues such as seatbelt and bike helmet use. Physical exam Your health care provider will check your: Height and weight. These may be used to calculate your BMI (body mass index). BMI is a measurement that tells if you are at a healthy weight. Waist circumference. This measures the distance around your waistline. This measurement also tells if you are at a healthy weight and may help predict your risk of certain diseases, such as type 2 diabetes and high blood pressure. Heart rate and blood pressure. Body temperature. Skin for abnormal spots. What immunizations do I need?  Vaccines are usually given at various ages, according to a schedule. Your health care provider will recommend vaccines for you based on your age, medical history, and lifestyle or other factors, such as travel or where you  work. What tests do I need? Screening Your health care provider may recommend screening tests for certain conditions. This may include: Lipid and cholesterol levels. Diabetes screening. This is done by checking your blood sugar (glucose) after you have not eaten for a while (fasting). Hepatitis C test. Hepatitis B test. HIV (human immunodeficiency virus) test. STI (sexually transmitted infection) testing, if you are at risk. Lung cancer screening. Colorectal cancer screening. Prostate cancer screening. Abdominal aortic aneurysm (AAA) screening. You may need this if you are a current or former smoker. Talk with your health care provider about your test results, treatment options, and if necessary, the need for more tests. Follow these instructions at home: Eating and drinking  Eat a diet that includes fresh fruits and vegetables, whole grains, lean protein, and low-fat dairy products. Limit your intake of foods with high amounts of sugar, saturated fats, and salt. Take vitamin and mineral supplements as recommended by your health care provider. Do not drink alcohol if your health care provider tells you not to drink. If you drink alcohol: Limit how much you have to 0-2 drinks a day. Know how much alcohol is in your drink. In the [place] one drink equals one 12 oz bottle of beer (355 mL), one 5 oz glass of wine (148 mL), or one 1 oz glass of hard liquor (44 mL). Lifestyle Brush your teeth every morning and night with fluoride toothpaste. Floss one time each day. Exercise for at least 30 minutes 5 or more days each week. Do not use any products that contain nicotine or tobacco. These products include cigarettes, chewing tobacco, and vaping devices, such as e-cigarettes. If you need help   quitting, ask your health care provider. Do not use drugs. If you are sexually active, practice safe sex. Use a condom or other form of protection to prevent STIs. Take aspirin only as told by your health  care provider. Make sure that you understand how much to take and what form to take. Work with your health care provider to find out whether it is safe and beneficial for you to take aspirin daily. Ask your health care provider if you need to take a cholesterol-lowering medicine (statin). Find healthy ways to manage stress, such as: Meditation, yoga, or listening to music. Journaling. Talking to a trusted person. Spending time with friends and family. Safety Always wear your seat belt while driving or riding in a vehicle. Do not drive: If you have been drinking alcohol. Do not ride with someone who has been drinking. When you are tired or distracted. While texting. If you have been using any mind-altering substances or drugs. Wear a helmet and other protective equipment during sports activities. If you have firearms in your house, make sure you follow all gun safety procedures. Minimize exposure to UV radiation to reduce your risk of skin cancer. What's next? Visit your health care provider once a year for an annual wellness visit. Ask your health care provider how often you should have your eyes and teeth checked. Stay up to date on all vaccines. This information is not intended to replace advice given to you by your health care provider. Make sure you discuss any questions you have with your health care provider. Document Revised: 05/31/2021 Document Reviewed: 05/31/2021 Elsevier Patient Education  2023 [employer]   

## 2022-05-29 LAB — CMP14+EGFR
ALT: 22 IU/L (ref 0–44)
AST: 22 IU/L (ref 0–40)
Albumin/Globulin Ratio: 2 (ref 1.2–2.2)
Albumin: 4.3 g/dL (ref 3.8–4.8)
Alkaline Phosphatase: 80 IU/L (ref 44–121)
BUN/Creatinine Ratio: 13 (ref 10–24)
BUN: 15 mg/dL (ref 8–27)
Bilirubin Total: 0.4 mg/dL (ref 0.0–1.2)
CO2: 24 mmol/L (ref 20–29)
Calcium: 9.2 mg/dL (ref 8.6–10.2)
Chloride: 101 mmol/L (ref 96–106)
Creatinine, Ser: 1.13 mg/dL (ref 0.76–1.27)
Globulin, Total: 2.1 g/dL (ref 1.5–4.5)
Glucose: 124 mg/dL — ABNORMAL HIGH (ref 70–99)
Potassium: 4.1 mmol/L (ref 3.5–5.2)
Sodium: 138 mmol/L (ref 134–144)
Total Protein: 6.4 g/dL (ref 6.0–8.5)
eGFR: 72 mL/min/{1.73_m2} (ref >59)

## 2022-05-29 LAB — LIPID PANEL
Chol/HDL Ratio: 2.3 ratio (ref 0.0–5.0)
Cholesterol, Total: 124 mg/dL (ref 100–199)
HDL: 55 mg/dL (ref >39)
LDL Chol Calc (NIH): 55 mg/dL (ref 0–99)
Triglycerides: 67 mg/dL (ref 0–149)
VLDL Cholesterol Cal: 14 mg/dL (ref 5–40)

## 2022-05-29 LAB — CBC
Hematocrit: 44 % (ref 37.5–51.0)
Hemoglobin: 15.1 g/dL (ref 13.0–17.7)
MCH: 31.9 pg (ref 26.6–33.0)
MCHC: 34.3 g/dL (ref 31.5–35.7)
MCV: 93 fL (ref 79–97)
Platelets: 245 10*3/uL (ref 150–450)
RBC: 4.74 x10E6/uL (ref 4.14–5.80)
RDW: 11.4 % — ABNORMAL LOW (ref 11.6–15.4)
WBC: 6.9 10*3/uL (ref 3.4–10.8)

## 2022-05-29 LAB — TSH: TSH: 2.05 u[IU]/mL (ref 0.450–4.500)

## 2022-05-29 LAB — VITAMIN B12: Vitamin B-12: 360 pg/mL (ref 232–1245)

## 2022-05-29 NOTE — Progress Notes (Signed)
Patient returning call about labs. Please call back.

## 2022-06-11 ENCOUNTER — Ambulatory Visit (HOSPITAL_BASED_OUTPATIENT_CLINIC_OR_DEPARTMENT_OTHER)
Admission: RE | Admit: 2022-06-11 | Discharge: 2022-06-11 | Disposition: A | Discharge status: Home / Self Care | Payer: Medicare Other | Source: Ambulatory Visit | Attending: Family Medicine | Admitting: Family Medicine

## 2022-06-11 DIAGNOSIS — R011 Cardiac murmur, unspecified: Secondary | ICD-10-CM | POA: Diagnosis not present

## 2022-06-11 LAB — ECHOCARDIOGRAM COMPLETE
AR max vel: 1.08 cm2
AV Area VTI: 1.02 cm2
AV Area mean vel: 1.02 cm2
AV Mean grad: 19 mmHg
AV Peak grad: 33.4 mmHg
Ao pk vel: 2.89 m/s
Area-P 1/2: 3.65 cm2
S' Lateral: 3.4 cm

## 2022-06-11 NOTE — Progress Notes (Signed)
  Echocardiogram 2D Echocardiogram has been performed.  Norman Briggs 06/11/2022, 4:47 PM

## 2022-06-12 ENCOUNTER — Other Ambulatory Visit: Payer: Self-pay | Admitting: *Deleted

## 2022-06-12 DIAGNOSIS — R931 Abnormal findings on diagnostic imaging of heart and coronary circulation: Secondary | ICD-10-CM

## 2022-07-24 ENCOUNTER — Ambulatory Visit (HOSPITAL_BASED_OUTPATIENT_CLINIC_OR_DEPARTMENT_OTHER): Payer: Medicare Other | Admitting: Cardiovascular Disease

## 2022-07-24 ENCOUNTER — Encounter (HOSPITAL_BASED_OUTPATIENT_CLINIC_OR_DEPARTMENT_OTHER): Payer: Self-pay | Admitting: Cardiovascular Disease

## 2022-07-24 DIAGNOSIS — I10 Essential (primary) hypertension: Secondary | ICD-10-CM

## 2022-07-24 DIAGNOSIS — I350 Nonrheumatic aortic (valve) stenosis: Secondary | ICD-10-CM | POA: Diagnosis not present

## 2022-07-24 DIAGNOSIS — I6529 Occlusion and stenosis of unspecified carotid artery: Secondary | ICD-10-CM

## 2022-07-24 DIAGNOSIS — I700 Atherosclerosis of aorta: Secondary | ICD-10-CM | POA: Diagnosis not present

## 2022-07-24 HISTORY — DX: Occlusion and stenosis of unspecified carotid artery: I65.29

## 2022-07-24 NOTE — Progress Notes (Deleted)
error 

## 2022-07-24 NOTE — Patient Instructions (Signed)
Medication Instructions:  Your physician recommends that you continue on your current medications as directed. Please refer to the Current Medication list given to you today.   *If you need a refill on your cardiac medications before your next appointment, please call your pharmacy*  Lab Work: NONE  Testing/Procedures: Your physician has requested that you have an echocardiogram. Echocardiography is a painless test that uses sound waves to create images of your heart. It provides your doctor with information about the size and shape of your heart and how well your heart's chambers and valves are working. This procedure takes approximately one hour. There are no restrictions for this procedure. TO BE DONE IN 1 YEAR   Your physician has requested that you have a carotid duplex. This test is an ultrasound of the carotid arteries in your neck. It looks at blood flow through these arteries that supply the brain with blood. Allow one hour for this exam. There are no restrictions or special instructions. OK TO HAVE WHEN YOU FOLLOW UP IN 1 MONTH   Follow-Up: At [facility], you and your health needs are our priority.  As part of our continuing mission to provide you with exceptional heart care, we have created designated Provider Care Teams.  These Care Teams include your primary Cardiologist (physician) and Advanced Practice Providers (APPs -  Physician Assistants and Nurse Practitioners) who all work together to provide you with the care you need, when you need it.  We recommend signing up for the patient portal called "MyChart".  Sign up information is provided on this After Visit Summary.  MyChart is used to connect with patients for Virtual Visits (Telemedicine).  Patients are able to view lab/test results, encounter notes, upcoming appointments, etc.  Non-urgent messages can be sent to your provider as well.   To learn more about what you can do with MyChart, go to [email or web address].     Your next appointment:   AFTER ECHO IN 12 month(s)  The format for your next appointment:   In Person  Provider:   Skeet Latch, MD   Your physician recommends that you schedule a follow-up appointment in: [place] W NP   Other Instructions MONITOR YOUR BLOOD PRESSURE TWICE A DAY, LOG ON SHEETS PROVIDE. BRING LOG AND BLOOD PRESSURE MACHINE TO FOLLOW UP

## 2022-07-24 NOTE — Assessment & Plan Note (Signed)
Mild carotid stenosis in 2018.  He is due for repeat Dopplers.  Lipids are well-controlled.  Continue aspirin and statin.

## 2022-07-24 NOTE — Assessment & Plan Note (Signed)
Blood pressure is elevated both initially and on repeat.  He thinks that it has been well-controlled at home.  He is going to track his blood pressures at home and bring both his recordings and his machine to follow-up.

## 2022-07-24 NOTE — Progress Notes (Signed)
Cardiology Office Note    Date:  07/24/2022   ID:  Mclain Freer, DOB [birth year], MRN [id number]  PCP:  Janora Norlander, DO  Cardiologist:   Skeet Latch, MD   No chief complaint on file.    History of Present Illness: Norman Briggs is a 66 y.o. male with mild carotid stenosis, hypertension, aortic stenosis, and hyperlipidemia  who presents for follow up.  He was initially seen 07/2015 for a pre-surgical risk assessment prior to knee surgery.  He had no exertional symptoms and was cleared for surgery. He reestablished care in 2018 and had no symptoms at the time. We recommended two year follow up. His PCP ordered an Echo 05/2022 that revealed LVEF 60-65% with grade 2 diastolic dysfunction. He had moderate aortic stenosis with mean gradient 19 mm Hg.   Today, he says he is feeling very well. He walks 4-5 days a week for about 3 miles per day. He recently played a weekend of golf in [place] which was about 10 miles of walking per day. He states that he would sometimes "huff and puff" up hills, but it was to be expected. He states his blood pressure readings at home are generally in the low 120s/70s. He endorses he has a little white coat hypertension. He has lost about 30 lbs, going from about 230 to 200 lbs. Initially he was taking 1 tablet of 10 mg rosuvastatin. After increasing his exercise and losing weight he self-titrated his statin to 1/2 tablet which he has been taking for years. He has been conscious of his diet and doing well. He denies any palpitations, chest pain, or peripheral edema. No lightheadedness, headaches, syncope, orthopnea, or PND.  Past Medical History:  Diagnosis Date   Allergy    Aortic atherosclerosis ([place]) 04/23/2018   Per CT scan   Arthritis    shoulder-right   Carotid stenosis 07/24/2022   History of elevated PSA 07/02/2013   Hyperlipidemia    Hypertension    Vitamin D deficiency     Past Surgical History:  Procedure Laterality Date   APPENDECTOMY   12/17/1974   COLONOSCOPY  2006 + 02/2016   diverticulosis - Carlean Purl   KNEE SURGERY Right    x 3 surgeries   lefgt wrist  Left    reapair- glass injury   PILONIDAL CYST EXCISION     SHOULDER SURGERY Right 05/31/2017     Current Outpatient Medications  Medication Sig Dispense Refill   amLODipine-valsartan (EXFORGE) 5-320 MG tablet Take 1 tablet by mouth daily. 90 tablet 3   aspirin EC 81 MG tablet Take 81 mg by mouth daily.     Azelastine HCl 137 MCG/SPRAY SOLN PLACE 1 SPRAY INTO BOTH NOSTRILS AT BEDTIME. USE IN EACH NOSTRIL AS DIRECTED 90 mL 3   Cholecalciferol (VITAMIN D-3) 5000 UNITS TABS Take 1 tablet by mouth daily.     hydrochlorothiazide (HYDRODIURIL) 25 MG tablet Take 0.5 tablets (12.5 mg total) by mouth daily. 45 tablet 3   Multiple Vitamins-Minerals (EYE-VITE PLUS LUTEIN) CAPS Take 1 capsule by mouth daily.      Omega-3 Fatty Acids (FISH OIL) 1000 MG CAPS Take 2 capsules by mouth daily.     rosuvastatin (CRESTOR) 10 MG tablet Take 0.5 tablets (5 mg total) by mouth daily. 45 tablet 3   sildenafil (REVATIO) 20 MG tablet TAKE 2-5 TABLETS BY MOUTH ONCE DAILY AS NEEDED FOR SEXUAL ACTIVITY 50 tablet 4   No current facility-administered medications for this visit.  Allergies:   Lipitor [atorvastatin], Anaprox [naproxen sodium], and Lisinopril    Social History:  The patient  reports that he quit smoking about 12 years ago. His smoking use included cigarettes. He smoked an average of .5 packs per day. He has never used smokeless tobacco. He reports current alcohol use of about 4.0 standard drinks of alcohol per week. He reports that he does not use drugs.   Family History:  The patient's family history includes Heart attack in his father; Hypertension in his mother.    ROS:  Please see the history of present illness.    (+) Exertional shortness of breath All other systems are reviewed and negative.    PHYSICAL EXAM: VS:  BP (!) 146/72 (BP Location: Right Arm, Patient  Position: Sitting, Cuff Size: Normal)   Pulse (!) 59   Ht '5\' 11"'$  (1.803 m)   Wt 204 lb 11.2 oz (92.9 kg)   BMI 28.55 kg/m  , BMI Body mass index is 28.55 kg/m. GENERAL:  Well appearing HEENT:  Pupils equal round and reactive, fundi not visualized, oral mucosa unremarkable NECK:  No jugular venous distention, waveform within normal limits, carotid upstroke brisk and symmetric, no bruits, no thyromegaly LYMPHATICS:  No cervical adenopathy LUNGS:  Clear to auscultation bilaterally HEART:  RRR.  PMI not displaced or sustained,S1 and S2 within normal limits, no S3, no S4, no clicks, no rubs, no murmurs ABD:  Flat, positive bowel sounds normal in frequency in pitch, no bruits, no rebound, no guarding, no midline pulsatile mass, no hepatomegaly, no splenomegaly EXT:  2 plus pulses throughout, no edema, no cyanosis no clubbing SKIN:  No rashes no nodules NEURO:  Cranial nerves II through XII grossly intact, motor grossly intact throughout PSYCH:  Cognitively intact, oriented to person place and time   EKG:  EKG is personally reviewed.  07/24/22: Sinus bradycardia. Rate 59 bpm. LAFB. 08/15/15: sinus rhythm.  Rate 71 bpm.  09/04/17: Sinus rhythm.  Rate 71 bpm.   Other studies reviewed:  Echo 06/11/2022: 1. Left ventricular ejection fraction, by estimation, is 60 to 65%. The  left ventricle has normal function. The left ventricle has no regional  wall motion abnormalities. Left ventricular diastolic parameters are  consistent with Grade II diastolic  dysfunction (pseudonormalization).   2. Right ventricular systolic function is normal. The right ventricular  size is normal. There is normal pulmonary artery systolic pressure.   3. Left atrial size was severely dilated.   4. The mitral valve is normal in structure. No evidence of mitral valve  regurgitation. No evidence of mitral stenosis.   5. The aortic valve is normal in structure. There is moderate  calcification of the aortic valve. There  is moderate thickening of the  aortic valve. Aortic valve regurgitation is not visualized. Moderate  aortic valve stenosis. Aortic valve area, by VTI  measures 1.02 cm. Aortic valve mean gradient measures 19.0 mmHg.   6. The inferior vena cava is normal in size with greater than 50%  respiratory variability, suggesting right atrial pressure of 3 mmHg.    CT Chest 05/30/2021: IMPRESSION: 1. Spectrum of findings compatible with mild fibrotic interstitial lung disease without clear apicobasilar gradient and without appreciable progression since 05/18/2019 high-resolution chest CT. Findings favor mild fibrotic NSIP as previously discussed. UIP is considered less likely. Findings are indeterminate for UIP per consensus guidelines: Diagnosis of Idiopathic Pulmonary Fibrosis: An Official ATS/ERS/JRS/ALAT Clinical Practice Guideline. [place], Iss 5, [phone], Aug 17 2017. 2. Three-vessel coronary atherosclerosis. 3. Moderate colonic diverticulosis. 4. Aortic Atherosclerosis (ICD10-I70.0) and Emphysema (ICD10-J43.9).   Recent Labs: 05/28/2022: ALT 22; BUN 15; Creatinine, Ser 1.13; Hemoglobin 15.1; Platelets 245; Potassium 4.1; Sodium 138; TSH 2.050    Lipid Panel    Component Value Date/Time   CHOL 124 05/28/2022 0953   CHOL 116 07/02/2013 0930   TRIG 67 05/28/2022 0953   TRIG 84 01/23/2017 1159   TRIG 71 07/02/2013 0930   HDL 55 05/28/2022 0953   HDL 47 01/23/2017 1159   HDL 45 07/02/2013 0930   CHOLHDL 2.3 05/28/2022 0953   LDLCALC 55 05/28/2022 0953   LDLCALC 98 07/08/2014 0849   LDLCALC 57 07/02/2013 0930      Wt Readings from Last 3 Encounters:  07/24/22 204 lb 11.2 oz (92.9 kg)  05/28/22 205 lb 9.6 oz (93.3 kg)  05/26/21 200 lb 6.4 oz (90.9 kg)      ASSESSMENT AND PLAN:  Aortic stenosis Mild-moderate aortic stenosis on echo.  Mean gradient 19 mmHg.  This is only 2 mmHg increased from years ago.  Repeat echo in one year.    Hypertension Blood pressure is elevated both initially and on repeat.  He thinks that it has been well-controlled at home.  He is going to track his blood pressures at home and bring both his recordings and his machine to follow-up.  Aortic atherosclerosis ([facility]) Lipids are well-controlled.  Continue aspirin and rosuvastatin.  Carotid stenosis Mild carotid stenosis in 2018.  He is due for repeat Dopplers.  Lipids are well-controlled.  Continue aspirin and statin.   Current medicines are reviewed at length with the patient today.  The patient does not have concerns regarding medicines.  The following changes have been made:  no change  Labs/ tests ordered today include:   Orders Placed This Encounter  Procedures   ECHOCARDIOGRAM COMPLETE   VAS US CAROTID     Disposition:  FU with PharmD in 1 month  FU with Neviah Braud C. Oval Linsey, MD, [facility] in 1 year.   I,Breanna Adamick,acting as a scribe for Skeet Latch, MD.,have documented all relevant documentation on the behalf of Skeet Latch, MD,as directed by  Skeet Latch, MD while in the presence of Skeet Latch, MD.   I, Nenzel Oval Linsey, MD have reviewed all documentation for this visit.  The documentation of the exam, diagnosis, procedures, and orders on 07/24/2022 are all accurate and complete.   Signed, Wilmer Berryhill C. Oval Linsey, MD, [facility]  07/24/2022 10:27 AM    [facility]

## 2022-07-24 NOTE — Assessment & Plan Note (Signed)
Mild-moderate aortic stenosis on echo.  Mean gradient 19 mmHg.  This is only 2 mmHg increased from years ago.  Repeat echo in one year.

## 2022-07-24 NOTE — Assessment & Plan Note (Signed)
Lipids are well-controlled.  Continue aspirin and rosuvastatin. 

## 2022-07-25 NOTE — Addendum Note (Signed)
Addended by: Alvina Filbert B on: 07/25/2022 08:33 AM   Modules accepted: Orders

## 2022-08-27 ENCOUNTER — Ambulatory Visit (HOSPITAL_BASED_OUTPATIENT_CLINIC_OR_DEPARTMENT_OTHER): Payer: Medicare Other | Admitting: Family

## 2022-08-27 ENCOUNTER — Encounter (HOSPITAL_BASED_OUTPATIENT_CLINIC_OR_DEPARTMENT_OTHER): Payer: Medicare Other

## 2022-08-27 ENCOUNTER — Ambulatory Visit (INDEPENDENT_AMBULATORY_CARE_PROVIDER_SITE_OTHER): Payer: Medicare Other | Admitting: Family

## 2022-08-27 ENCOUNTER — Encounter (HOSPITAL_BASED_OUTPATIENT_CLINIC_OR_DEPARTMENT_OTHER): Payer: Self-pay | Admitting: Family

## 2022-08-27 ENCOUNTER — Ambulatory Visit (INDEPENDENT_AMBULATORY_CARE_PROVIDER_SITE_OTHER): Payer: Medicare Other

## 2022-08-27 VITALS — BP 128/70 | HR 60 | Ht 71.0 in | Wt 197.9 lb

## 2022-08-27 DIAGNOSIS — E785 Hyperlipidemia, unspecified: Secondary | ICD-10-CM | POA: Diagnosis not present

## 2022-08-27 DIAGNOSIS — I6523 Occlusion and stenosis of bilateral carotid arteries: Secondary | ICD-10-CM

## 2022-08-27 DIAGNOSIS — I350 Nonrheumatic aortic (valve) stenosis: Secondary | ICD-10-CM | POA: Diagnosis not present

## 2022-08-27 DIAGNOSIS — I700 Atherosclerosis of aorta: Secondary | ICD-10-CM | POA: Diagnosis not present

## 2022-08-27 DIAGNOSIS — I6529 Occlusion and stenosis of unspecified carotid artery: Secondary | ICD-10-CM | POA: Diagnosis not present

## 2022-08-27 DIAGNOSIS — I10 Essential (primary) hypertension: Secondary | ICD-10-CM

## 2022-08-27 MED ORDER — AMLODIPINE BESYLATE-VALSARTAN 5-320 MG PO TABS: 0.5000 | ORAL_TABLET | Freq: Every day | ORAL | 3 refills | Status: DC

## 2022-08-27 NOTE — Patient Instructions (Addendum)
Medication Instructions:  Your physician has recommended you make the following change in your medication:   REDUCE  Amlodipine-Valsartan to half tablet daily  Check blood pressure once per day at least 2 hours after medications.   If blood pressure consistently >150 on home monitor, call us.    *If you need a refill on your cardiac medications before your next appointment, please call your pharmacy*  Testing/Procedures: Echo ordered for 05/2023.   Follow-Up: At [facility], you and your health needs are our priority.  As part of our continuing mission to provide you with exceptional heart care, we have created designated Provider Care Teams.  These Care Teams include your primary Cardiologist (physician) and Advanced Practice Providers (APPs -  Physician Assistants and Nurse Practitioners) who all work together to provide you with the care you need, when you need it.  We recommend signing up for the patient portal called "MyChart".  Sign up information is provided on this After Visit Summary.  MyChart is used to connect with patients for Virtual Visits (Telemedicine).  Patients are able to view lab/test results, encounter notes, upcoming appointments, etc.  Non-urgent messages can be sent to your provider as well.   To learn more about what you can do with MyChart, go to [email or web address].    Your next appointment:   05/2023 with Dr. Oval Linsey  Other Instructions   Our manual BP reading today: 128/70 Your battery operated cuff: 162/87  Your blood pressure cuff at home reads about 20 points high.  If your blood pressure at home is consistently more than 150 at home this tells [place] it is too high.   Tips to Measure your Blood Pressure Correctly Here's what you can do to ensure a correct reading:  Don't drink a caffeinated beverage or smoke during the 30 minutes before the test.  Sit quietly for five minutes before the test begins.  During the measurement, sit in a chair  with your feet on the floor and your arm supported so your elbow is at about heart level.  The inflatable part of the cuff should completely cover at least 80% of your upper arm, and the cuff should be placed on bare skin, not over a shirt.  Don't talk during the measurement.  Blood pressure categories  Blood pressure category SYSTOLIC (upper number)  DIASTOLIC (lower number)  Normal Less than 120 mm Hg and Less than 80 mm Hg  Elevated 120-129 mm Hg and Less than 80 mm Hg  High blood pressure: Stage 1 hypertension 130-139 mm Hg or 80-89 mm Hg  High blood pressure: Stage 2 hypertension 140 mm Hg or higher or 90 mm Hg or higher  Hypertensive crisis (consult your doctor immediately) Higher than 180 mm Hg and/or Higher than 120 mm Hg  Source: American Heart Association and American Stroke Association. For more on getting your blood pressure under control, buy Controlling Your Blood Pressure, a Special Health Report from [facility].   Blood Pressure Log   Date   Time  Blood Pressure  Example: Nov 1 9 AM 124/78

## 2022-08-27 NOTE — Progress Notes (Signed)
Office Visit    Patient Name: Norman Briggs Date of Encounter: 08/27/2022  PCP:  Janora Norlander [place]  Cardiologist:  Skeet Latch, MD  Advanced Practice Provider:  No care team member to display Electrophysiologist:  None      Chief Complaint    Norman Briggs is a 67 y.o. male presents today for hypertension follow up.    Past Medical History    Past Medical History:  Diagnosis Date   Allergy    Aortic atherosclerosis ([place]) 04/23/2018   Per CT scan   Arthritis    shoulder-right   Carotid stenosis 07/24/2022   History of elevated PSA 07/02/2013   Hyperlipidemia    Hypertension    Vitamin D deficiency    Past Surgical History:  Procedure Laterality Date   APPENDECTOMY  12/17/1974   COLONOSCOPY  2006 + 02/2016   diverticulosis - Carlean Purl   KNEE SURGERY Right    x 3 surgeries   lefgt wrist  Left    reapair- glass injury   PILONIDAL CYST EXCISION     SHOULDER SURGERY Right 05/31/2017    Allergies  Allergies  Allergen Reactions   Lipitor [Atorvastatin]     Joint pain and myalgias   Anaprox [Naproxen Sodium] Rash   Lisinopril Cough    History of Present Illness    Norman Briggs is a 66 y.o. male with a hx of carotid stenosis, hypertension, aortic stenosis, hyperlipidemia last seen 07/24/2022 by Dr. Oval Linsey.  Echo 06/11/2022 LVEF 60 to 99%, grade 2 diastolic dysfunction, RV normal size and function, normal PASP, LA severely dilated, moderate calcification of the aortic valve with moderate aortic valve stenosis (mean gradient 19.0 mmHg).  Seen by Dr. Sandrea Matte of 07/24/2022.  Recommended for repeat echo 05/2023.  BP elevated initially and on repeat.  Recommended to monitor BP at home and bring log as well as machine to next follow-up.  Repeat Dopplers ordered.  He presents today for follow-up.  Walks 3 miles 5 days per week. Active playing golf. Notes only lightheaded if he laughs really hard but no near syncope, syncope. BP at home  109/55-143/77. His home BP cuff is found to read 20 points higher than manual reading in clinic today. Reports no shortness of breath nor dyspnea on exertion. Reports no chest pain, pressure, or tightness. No edema, orthopnea, PND. Reports no palpitations.    EKGs/Labs/Other Studies Reviewed:   The following studies were reviewed today:  Echo 06/11/2022: 1. Left ventricular ejection fraction, by estimation, is 60 to 65%. The  left ventricle has normal function. The left ventricle has no regional  wall motion abnormalities. Left ventricular diastolic parameters are  consistent with Grade II diastolic  dysfunction (pseudonormalization).   2. Right ventricular systolic function is normal. The right ventricular  size is normal. There is normal pulmonary artery systolic pressure.   3. Left atrial size was severely dilated.   4. The mitral valve is normal in structure. No evidence of mitral valve  regurgitation. No evidence of mitral stenosis.   5. The aortic valve is normal in structure. There is moderate  calcification of the aortic valve. There is moderate thickening of the  aortic valve. Aortic valve regurgitation is not visualized. Moderate  aortic valve stenosis. Aortic valve area, by VTI  measures 1.02 cm. Aortic valve mean gradient measures 19.0 mmHg.   6. The inferior vena cava is normal in size with greater than 50%  respiratory variability, suggesting right  atrial pressure of 3 mmHg.     CT Chest 05/30/2021: IMPRESSION: 1. Spectrum of findings compatible with mild fibrotic interstitial lung disease without clear apicobasilar gradient and without appreciable progression since 05/18/2019 high-resolution chest CT. Findings favor mild fibrotic NSIP as previously discussed. UIP is considered less likely. Findings are indeterminate for UIP per consensus guidelines: Diagnosis of Idiopathic Pulmonary Fibrosis: An Official ATS/ERS/JRS/ALAT Clinical Practice Guideline. [place], Iss 5, [phone], Aug 17 2017. 2. Three-vessel coronary atherosclerosis. 3. Moderate colonic diverticulosis. 4. Aortic Atherosclerosis (ICD10-I70.0) and Emphysema (ICD10-J43.9).   EKG: No EKG today  Recent Labs: 05/28/2022: ALT 22; BUN 15; Creatinine, Ser 1.13; Hemoglobin 15.1; Platelets 245; Potassium 4.1; Sodium 138; TSH 2.050  Recent Lipid Panel    Component Value Date/Time   CHOL 124 05/28/2022 0953   CHOL 116 07/02/2013 0930   TRIG 67 05/28/2022 0953   TRIG 84 01/23/2017 1159   TRIG 71 07/02/2013 0930   HDL 55 05/28/2022 0953   HDL 47 01/23/2017 1159   HDL 45 07/02/2013 0930   CHOLHDL 2.3 05/28/2022 0953   LDLCALC 55 05/28/2022 0953   LDLCALC 98 07/08/2014 0849   LDLCALC 57 07/02/2013 0930   Home Medications   Current Meds  Medication Sig   aspirin EC 81 MG tablet Take 81 mg by mouth daily.   Azelastine HCl 137 MCG/SPRAY SOLN PLACE 1 SPRAY INTO BOTH NOSTRILS AT BEDTIME. USE IN EACH NOSTRIL AS DIRECTED   Cholecalciferol (VITAMIN D-3) 5000 UNITS TABS Take 1 tablet by mouth daily.   hydrochlorothiazide (HYDRODIURIL) 25 MG tablet Take 0.5 tablets (12.5 mg total) by mouth daily.   Multiple Vitamins-Minerals (EYE-VITE PLUS LUTEIN) CAPS Take 1 capsule by mouth daily.    Omega-3 Fatty Acids (FISH OIL) 1000 MG CAPS Take 2 capsules by mouth daily.   rosuvastatin (CRESTOR) 10 MG tablet Take 0.5 tablets (5 mg total) by mouth daily.   sildenafil (REVATIO) 20 MG tablet TAKE 2-5 TABLETS BY MOUTH ONCE DAILY AS NEEDED FOR SEXUAL ACTIVITY   [DISCONTINUED] amLODipine-valsartan (EXFORGE) 5-320 MG tablet Take 1 tablet by mouth daily.     Review of Systems      All other systems reviewed and are otherwise negative except as noted above.  Physical Exam    VS:  BP 128/70 (BP Location: Right Arm, Patient Position: Sitting, Cuff Size: Normal)   Pulse 60   Ht '5\' 11"'$  (1.803 m)   Wt 197 lb 14.4 oz (89.8 kg)   BMI 27.60 kg/m  , BMI Body mass index is 27.6 kg/m.  Wt  Readings from Last 3 Encounters:  08/27/22 197 lb 14.4 oz (89.8 kg)  07/24/22 204 lb 11.2 oz (92.9 kg)  05/28/22 205 lb 9.6 oz (93.3 kg)    GEN: Well nourished, well developed, in no acute distress. HEENT: normal. Neck: Supple, no JVD, carotid bruits, or masses. Cardiac: RRR, no murmurs, rubs, or gallops. No clubbing, cyanosis, edema.  Radials/PT 2+ and equal bilaterally.  Respiratory:  Respirations regular and unlabored, clear to auscultation bilaterally. GI: Soft, nontender, nondistended. MS: No deformity or atrophy. Skin: Warm and dry, no rash. Neuro:  Strength and sensation are intact. Psych: Normal affect.  Assessment & Plan    Mild to moderate aortic stenosis by echo - Due for repeat echo 05/2023. Educated to report syncope, dyspnea, chest pain. Continue optimal BP control.   Aortic atherosclerosis/hyperlipidemia- Stable with no anginal symptoms. No indication for ischemic evaluation.  Most recent LDL <70. Continue Rosuvastatin  $'5mg'D$  QD.   Hypertension- BP initially elevated though improved to goal with rest. Bp at home 110s-140s though home BP cuff found to read 20 points higher in clinic today. Discussed to contact us if BP on home cuff consistently >130/80. Likely hypotensive at home. Occasional episodes of lightheadedness. Reports difficulties with erectile dysfunction and wonders if medications contributory. Reduce Amlodipine-Valsartan to half tablet daily. Continue HCTZ 12.'5mg'$  QD. Check in via MyChart in one week.  Carotid stenosis-mild carotid stenosis in 2018.  Repeat duplex performed earlier today.  Results not yet available.  Continue aspirin and statin.         Disposition: Follow up  05/2023  with Skeet Latch, MD or APP.  Signed, Loel Dubonnet, NP 08/27/2022, 8:41 PM [facility]

## 2022-08-28 ENCOUNTER — Ambulatory Visit (HOSPITAL_BASED_OUTPATIENT_CLINIC_OR_DEPARTMENT_OTHER): Payer: Medicare Other | Admitting: Family

## 2022-09-10 ENCOUNTER — Encounter (HOSPITAL_BASED_OUTPATIENT_CLINIC_OR_DEPARTMENT_OTHER): Payer: Self-pay

## 2022-11-15 DIAGNOSIS — L814 Other melanin hyperpigmentation: Secondary | ICD-10-CM | POA: Diagnosis not present

## 2022-11-15 DIAGNOSIS — L578 Other skin changes due to chronic exposure to nonionizing radiation: Secondary | ICD-10-CM | POA: Diagnosis not present

## 2022-11-15 DIAGNOSIS — L821 Other seborrheic keratosis: Secondary | ICD-10-CM | POA: Diagnosis not present

## 2022-11-15 DIAGNOSIS — L739 Follicular disorder, unspecified: Secondary | ICD-10-CM | POA: Diagnosis not present

## 2022-11-15 DIAGNOSIS — D1801 Hemangioma of skin and subcutaneous tissue: Secondary | ICD-10-CM | POA: Diagnosis not present

## 2022-11-15 DIAGNOSIS — Z872 Personal history of diseases of the skin and subcutaneous tissue: Secondary | ICD-10-CM | POA: Diagnosis not present

## 2022-11-15 DIAGNOSIS — L570 Actinic keratosis: Secondary | ICD-10-CM | POA: Diagnosis not present

## 2022-11-15 DIAGNOSIS — Z1283 Encounter for screening for malignant neoplasm of skin: Secondary | ICD-10-CM | POA: Diagnosis not present

## 2022-11-15 DIAGNOSIS — L812 Freckles: Secondary | ICD-10-CM | POA: Diagnosis not present

## 2022-11-15 DIAGNOSIS — L719 Rosacea, unspecified: Secondary | ICD-10-CM | POA: Diagnosis not present

## 2022-11-15 DIAGNOSIS — D225 Melanocytic nevi of trunk: Secondary | ICD-10-CM | POA: Diagnosis not present

## 2023-05-16 DIAGNOSIS — M1712 Unilateral primary osteoarthritis, left knee: Secondary | ICD-10-CM | POA: Diagnosis not present

## 2023-05-16 DIAGNOSIS — M25562 Pain in left knee: Secondary | ICD-10-CM | POA: Diagnosis not present

## 2023-05-19 DIAGNOSIS — M1712 Unilateral primary osteoarthritis, left knee: Secondary | ICD-10-CM | POA: Diagnosis not present

## 2023-05-22 ENCOUNTER — Telehealth (HOSPITAL_BASED_OUTPATIENT_CLINIC_OR_DEPARTMENT_OTHER): Payer: Self-pay | Admitting: Cardiovascular Disease

## 2023-05-22 DIAGNOSIS — M9901 Segmental and somatic dysfunction of cervical region: Secondary | ICD-10-CM | POA: Diagnosis not present

## 2023-05-22 DIAGNOSIS — M9902 Segmental and somatic dysfunction of thoracic region: Secondary | ICD-10-CM | POA: Diagnosis not present

## 2023-05-22 DIAGNOSIS — M9903 Segmental and somatic dysfunction of lumbar region: Secondary | ICD-10-CM | POA: Diagnosis not present

## 2023-05-22 DIAGNOSIS — M9905 Segmental and somatic dysfunction of pelvic region: Secondary | ICD-10-CM | POA: Diagnosis not present

## 2023-05-22 NOTE — Telephone Encounter (Signed)
Left message for patient to call and schedule the Echocardiogram ordered by Dr. [place] 

## 2023-05-26 ENCOUNTER — Other Ambulatory Visit: Payer: Self-pay | Admitting: Family Medicine

## 2023-05-26 DIAGNOSIS — I10 Essential (primary) hypertension: Secondary | ICD-10-CM

## 2023-05-28 DIAGNOSIS — M9902 Segmental and somatic dysfunction of thoracic region: Secondary | ICD-10-CM | POA: Diagnosis not present

## 2023-05-28 DIAGNOSIS — M9905 Segmental and somatic dysfunction of pelvic region: Secondary | ICD-10-CM | POA: Diagnosis not present

## 2023-05-28 DIAGNOSIS — M9903 Segmental and somatic dysfunction of lumbar region: Secondary | ICD-10-CM | POA: Diagnosis not present

## 2023-05-28 DIAGNOSIS — M9901 Segmental and somatic dysfunction of cervical region: Secondary | ICD-10-CM | POA: Diagnosis not present

## 2023-05-29 DIAGNOSIS — M9905 Segmental and somatic dysfunction of pelvic region: Secondary | ICD-10-CM | POA: Diagnosis not present

## 2023-05-29 DIAGNOSIS — M9902 Segmental and somatic dysfunction of thoracic region: Secondary | ICD-10-CM | POA: Diagnosis not present

## 2023-05-29 DIAGNOSIS — M9901 Segmental and somatic dysfunction of cervical region: Secondary | ICD-10-CM | POA: Diagnosis not present

## 2023-05-29 DIAGNOSIS — M9903 Segmental and somatic dysfunction of lumbar region: Secondary | ICD-10-CM | POA: Diagnosis not present

## 2023-05-29 NOTE — Telephone Encounter (Signed)
Opened in error

## 2023-05-31 ENCOUNTER — Ambulatory Visit (INDEPENDENT_AMBULATORY_CARE_PROVIDER_SITE_OTHER): Payer: Medicare Other | Admitting: Family Medicine

## 2023-05-31 ENCOUNTER — Encounter: Payer: Self-pay | Admitting: Family Medicine

## 2023-05-31 VITALS — BP 126/78 | HR 55 | Temp 98.6°F | Ht 71.0 in | Wt 181.0 lb

## 2023-05-31 DIAGNOSIS — J301 Allergic rhinitis due to pollen: Secondary | ICD-10-CM | POA: Diagnosis not present

## 2023-05-31 DIAGNOSIS — J849 Interstitial pulmonary disease, unspecified: Secondary | ICD-10-CM | POA: Diagnosis not present

## 2023-05-31 DIAGNOSIS — E7800 Pure hypercholesterolemia, unspecified: Secondary | ICD-10-CM

## 2023-05-31 DIAGNOSIS — I700 Atherosclerosis of aorta: Secondary | ICD-10-CM

## 2023-05-31 DIAGNOSIS — M1712 Unilateral primary osteoarthritis, left knee: Secondary | ICD-10-CM

## 2023-05-31 DIAGNOSIS — Z0001 Encounter for general adult medical examination with abnormal findings: Secondary | ICD-10-CM

## 2023-05-31 DIAGNOSIS — I10 Essential (primary) hypertension: Secondary | ICD-10-CM

## 2023-05-31 DIAGNOSIS — Z0000 Encounter for general adult medical examination without abnormal findings: Secondary | ICD-10-CM

## 2023-05-31 DIAGNOSIS — N5201 Erectile dysfunction due to arterial insufficiency: Secondary | ICD-10-CM

## 2023-05-31 LAB — CBC
Hematocrit: 44.5 % (ref 37.5–51.0)
Hemoglobin: 14.9 g/dL (ref 13.0–17.7)
MCH: 32 pg (ref 26.6–33.0)
MCHC: 33.5 g/dL (ref 31.5–35.7)
MCV: 96 fL (ref 79–97)
Platelets: 253 10*3/uL (ref 150–450)
RBC: 4.65 x10E6/uL (ref 4.14–5.80)
RDW: 11.8 % (ref 11.6–15.4)
WBC: 6.4 10*3/uL (ref 3.4–10.8)

## 2023-05-31 LAB — CMP14+EGFR
ALT: 17 IU/L (ref 0–44)
AST: 22 IU/L (ref 0–40)
Albumin/Globulin Ratio: 2
Albumin: 4.2 g/dL (ref 3.9–4.9)
Alkaline Phosphatase: 82 IU/L (ref 44–121)
BUN/Creatinine Ratio: 17 (ref 10–24)
BUN: 17 mg/dL (ref 8–27)
Bilirubin Total: 0.6 mg/dL (ref 0.0–1.2)
CO2: 26 mmol/L (ref 20–29)
Calcium: 9.4 mg/dL (ref 8.6–10.2)
Chloride: 101 mmol/L (ref 96–106)
Creatinine, Ser: 1.03 mg/dL (ref 0.76–1.27)
Globulin, Total: 2.1 g/dL (ref 1.5–4.5)
Glucose: 107 mg/dL — ABNORMAL HIGH (ref 70–99)
Potassium: 4.4 mmol/L (ref 3.5–5.2)
Sodium: 138 mmol/L (ref 134–144)
Total Protein: 6.3 g/dL (ref 6.0–8.5)
eGFR: 80 mL/min/{1.73_m2} (ref >59)

## 2023-05-31 LAB — LIPID PANEL
Chol/HDL Ratio: 1.8 ratio (ref 0.0–5.0)
Cholesterol, Total: 147 mg/dL (ref 100–199)
HDL: 81 mg/dL (ref >39)
LDL Chol Calc (NIH): 53 mg/dL (ref 0–99)
Triglycerides: 62 mg/dL (ref 0–149)
VLDL Cholesterol Cal: 13 mg/dL (ref 5–40)

## 2023-05-31 LAB — TSH: TSH: 1.46 u[IU]/mL (ref 0.450–4.500)

## 2023-05-31 MED ORDER — AMLODIPINE BESYLATE-VALSARTAN 5-320 MG PO TABS: 0.5000 | ORAL_TABLET | Freq: Every day | ORAL | 3 refills | Status: DC

## 2023-05-31 MED ORDER — SILDENAFIL CITRATE 20 MG PO TABS
ORAL_TABLET | ORAL | 4 refills | Status: DC
Start: 2023-05-31 — End: 2024-03-23

## 2023-05-31 MED ORDER — AZELASTINE HCL 137 MCG/SPRAY NA SOLN: NASAL | 3 refills | Status: DC

## 2023-05-31 MED ORDER — HYDROCHLOROTHIAZIDE 25 MG PO TABS: 12.5000 mg | ORAL_TABLET | Freq: Every day | ORAL | 3 refills | Status: DC

## 2023-05-31 MED ORDER — ROSUVASTATIN CALCIUM 10 MG PO TABS: 5.0000 mg | ORAL_TABLET | Freq: Every day | ORAL | 3 refills | Status: DC

## 2023-05-31 NOTE — Patient Instructions (Signed)
Recombinant Zoster (Shingles) Vaccine: What You Need to Know Many vaccine information statements are available in Spanish and other languages. See PromoAge.com.br. 1. Why get vaccinated? Recombinant zoster (shingles) vaccine can prevent shingles. Shingles (also called herpes zoster, or just zoster) is a painful skin rash, usually with blisters. In addition to the rash, shingles can cause fever, headache, chills, or upset stomach. Rarely, shingles can lead to complications such as pneumonia, hearing problems, blindness, brain inflammation (encephalitis), or death. The risk of shingles increases with age. The most common complication of shingles is long-term nerve pain called postherpetic neuralgia (PHN). PHN occurs in the areas where the shingles rash was and can last for months or years after the rash goes away. The pain from PHN can be severe and debilitating. The risk of PHN increases with age. An older adult with shingles is more likely to develop PHN and have longer lasting and more severe pain than a younger person. People with weakened immune systems also have a higher risk of getting shingles and complications from the disease. Shingles is caused by varicella-zoster virus, the same virus that causes chickenpox. After you have chickenpox, the virus stays in your body and can cause shingles later in life. Shingles cannot be passed from one person to another, but the virus that causes shingles can spread and cause chickenpox in someone who has never had chickenpox or has never received chickenpox vaccine. 2. Recombinant shingles vaccine Recombinant shingles vaccine provides strong protection against shingles. By preventing shingles, recombinant shingles vaccine also protects against PHN and other complications. Recombinant shingles vaccine is recommended for: Adults 50 years and older Adults 19 years and older who have a weakened immune system because of disease or treatments Shingles vaccine  is given as a two-dose series. For most people, the second dose should be given 2 to 6 months after the first dose. Some people who have or will have a weakened immune system can get the second dose 1 to 2 months after the first dose. Ask your health care provider for guidance. People who have had shingles in the past and people who have received varicella (chickenpox) vaccine are recommended to get recombinant shingles vaccine. The vaccine is also recommended for people who have already gotten another type of shingles vaccine, the live shingles vaccine. There is no live virus in recombinant shingles vaccine. Shingles vaccine may be given at the same time as other vaccines. 3. Talk with your health care provider Tell your vaccination provider if the person getting the vaccine: Has had an allergic reaction after a previous dose of recombinant shingles vaccine, or has any severe, life-threatening allergies Is currently experiencing an episode of shingles Is pregnant In some cases, your health care provider may decide to postpone shingles vaccination until a future visit. People with minor illnesses, such as a cold, may be vaccinated. People who are moderately or severely ill should usually wait until they recover before getting recombinant shingles vaccine. Your health care provider can give you more information. 4. Risks of a vaccine reaction A sore arm with mild or moderate pain is very common after recombinant shingles vaccine. Redness and swelling can also happen at the site of the injection. Tiredness, muscle pain, headache, shivering, fever, stomach pain, and nausea are common after recombinant shingles vaccine. These side effects may temporarily prevent a vaccinated person from doing regular activities. Symptoms usually go away on their own in 2 to 3 days. You should still get the second dose of recombinant shingles vaccine  even if you had one of these reactions after the first  dose. Guillain-Barr syndrome (GBS), a serious nervous system disorder, has been reported very rarely after recombinant zoster vaccine. People sometimes faint after medical procedures, including vaccination. Tell your provider if you feel dizzy or have vision changes or ringing in the ears. As with any medicine, there is a very remote chance of a vaccine causing a severe allergic reaction, other serious injury, or death. 5. What if there is a serious problem? An allergic reaction could occur after the vaccinated person leaves the clinic. If you see signs of a severe allergic reaction (hives, swelling of the face and throat, difficulty breathing, a fast heartbeat, dizziness, or weakness), call 9-1-1 and get the person to the nearest hospital. For other signs that concern you, call your health care provider. Adverse reactions should be reported to the Vaccine Adverse Event Reporting System (VAERS). Your health care provider will usually file this report, or you can do it yourself. Visit the VAERS website at www.vaers.LAgents.no or call 475-628-4723. VAERS is only for reporting reactions, and VAERS staff members do not give medical advice. 6. How can I learn more? Ask your health care provider. Call your local or state health department. Visit the website of the Food and Drug Administration (FDA) for vaccine package inserts and additional information at GoldCloset.com.ee. Contact the Centers for Disease Control and Prevention (CDC): Call 318-337-3350 (1-800-CDC-INFO) or Visit CDC's website at PicCapture.uy. Source: CDC Vaccine Information Statement Recombinant Zoster Vaccine (01/20/2021) This same material is available at FootballExhibition.com.br for no charge. This information is not intended to replace advice given to you by your health care provider. Make sure you discuss any questions you have with your health care provider. Document Revised: 12/19/2022 Document Reviewed:  12/19/2022 Elsevier Patient Education  2024 [employer].

## 2023-05-31 NOTE — Progress Notes (Signed)
Norman Briggs is a 66 y.o. male presents to office today for annual physical exam examination.    Concerns today include: 1. ***  Occupation: ***, Marital status: ***, Substance use: *** Diet: ***, Exercise: *** Last eye exam: *** Last dental exam: *** Last colonoscopy: *** Last mammogram: *** Last pap smear: *** Refills needed today: *** Immunizations needed: Immunization History  Administered Date(s) Administered   Influenza, High Dose Seasonal PF 09/25/2021   Influenza, Quadrivalent, Recombinant, Inj, Pf 11/05/2019   Influenza,inj,Quad PF,6+ Mos 01/07/2014, 09/19/2015, 10/15/2016, 11/12/2017, 10/21/2018   Influenza,inj,quad, With Preservative 11/25/2014   Moderna SARS-COV2 Booster Vaccination 11/14/2020   Moderna Sars-Covid-2 Vaccination 03/02/2020, 03/30/2020   PNEUMOCOCCAL CONJUGATE-20 05/28/2022   Pneumococcal Conjugate-13 05/26/2021   Td 06/01/2020   Tdap 03/07/2010   Zoster Recombinat (Shingrix) 05/28/2022     Past Medical History:  Diagnosis Date   Allergy    Aortic atherosclerosis (HCC) 04/23/2018   Per CT scan   Arthritis    shoulder-right   Carotid stenosis 07/24/2022   History of elevated PSA 07/02/2013   Hyperlipidemia    Hypertension    Vitamin D deficiency    Social History   Socioeconomic History   Marital status: Divorced    Spouse name: Not on file   Number of children: Not on file   Years of education: Not on file   Highest education level: Not on file  Occupational History   Not on file  Tobacco Use   Smoking status: Former    Packs/day: .5    Types: Cigarettes    Quit date: 07/02/2010    Years since quitting: 12.9   Smokeless tobacco: Never   Tobacco comments:    "social smoker" per patient.   Vaping Use   Vaping Use: Never used  Substance and Sexual Activity   Alcohol use: Yes    Alcohol/week: 4.0 standard drinks of alcohol    Types: 4 Cans of beer per week    Comment: occ   Drug use: No   Sexual activity: Not on file  Other  Topics Concern   Not on file  Social History Narrative   Not on file   Social Determinants of Health   Financial Resource Strain: Low Risk  (07/24/2022)   Overall Financial Resource Strain (CARDIA)    Difficulty of Paying Living Expenses: Not hard at all  Food Insecurity: No Food Insecurity (07/24/2022)   Hunger Vital Sign    Worried About Running Out of Food in the Last Year: Never true    Ran Out of Food in the Last Year: Never true  Transportation Needs: No Transportation Needs (07/24/2022)   PRAPARE - Administrator, Civil Service (Medical): No    Lack of Transportation (Non-Medical): No  Physical Activity: Sufficiently Active (07/24/2022)   Exercise Vital Sign    Days of Exercise per Week: 5 days    Minutes of Exercise per Session: 50 min  Stress: Not on file  Social Connections: Not on file  Intimate Partner Violence: Not on file   Past Surgical History:  Procedure Laterality Date   APPENDECTOMY  12/17/1974   COLONOSCOPY  2006 + 02/2016   diverticulosis - [facility]   KNEE SURGERY Right    x 3 surgeries   lefgt wrist  Left    reapair- glass injury   PILONIDAL CYST EXCISION     SHOULDER SURGERY Right 05/31/2017   Family History  Problem Relation Age of Onset   Hypertension Mother  Heart attack Father    Colon cancer Neg Hx    Colon polyps Neg Hx    Esophageal cancer Neg Hx    Rectal cancer Neg Hx    Stomach cancer Neg Hx     Current Outpatient Medications:    amLODipine-valsartan (EXFORGE) 5-320 MG tablet, Take 0.5 tablets by mouth daily., Disp: 45 tablet, Rfl: 3   aspirin EC 81 MG tablet, Take 81 mg by mouth daily., Disp: , Rfl:    Azelastine HCl 137 MCG/SPRAY SOLN, PLACE 1 SPRAY INTO BOTH NOSTRILS AT BEDTIME. USE IN EACH NOSTRIL AS DIRECTED, Disp: 90 mL, Rfl: 3   Cholecalciferol (VITAMIN D-3) 5000 UNITS TABS, Take 1 tablet by mouth daily., Disp: , Rfl:    hydrochlorothiazide (HYDRODIURIL) 25 MG tablet, TAKE 1/2 TABLET BY MOUTH EVERY DAY, Disp: 45 tablet,  Rfl: 0   Multiple Vitamins-Minerals (EYE-VITE PLUS LUTEIN) CAPS, Take 1 capsule by mouth daily. , Disp: , Rfl:    Omega-3 Fatty Acids (FISH OIL) 1000 MG CAPS, Take 2 capsules by mouth daily., Disp: , Rfl:    rosuvastatin (CRESTOR) 10 MG tablet, Take 0.5 tablets (5 mg total) by mouth daily., Disp: 45 tablet, Rfl: 3   sildenafil (REVATIO) 20 MG tablet, TAKE 2-5 TABLETS BY MOUTH ONCE DAILY AS NEEDED FOR SEXUAL ACTIVITY, Disp: 50 tablet, Rfl: 4  Allergies  Allergen Reactions   Lipitor [Atorvastatin]     Joint pain and myalgias   Anaprox [Naproxen Sodium] Rash   Lisinopril Cough     ROS: Review of Systems {ros; complete:30496}    Physical exam {Exam, Complete:[phone]}    Assessment/ Plan: Norman Briggs here for annual physical exam.   No problem-specific Assessment & Plan notes found for this encounter.   Counseled on healthy lifestyle choices, including diet (rich in fruits, vegetables and lean meats and low in salt and simple carbohydrates) and exercise (at least 30 minutes of moderate physical activity daily).  Patient to follow up in 1 year for annual exam or sooner if needed.  Norman Briggs M. Nadine Counts, DO

## 2023-06-01 ENCOUNTER — Encounter: Payer: Self-pay | Admitting: Family Medicine

## 2023-06-11 DIAGNOSIS — M9903 Segmental and somatic dysfunction of lumbar region: Secondary | ICD-10-CM | POA: Diagnosis not present

## 2023-06-11 DIAGNOSIS — M9905 Segmental and somatic dysfunction of pelvic region: Secondary | ICD-10-CM | POA: Diagnosis not present

## 2023-06-11 DIAGNOSIS — M9902 Segmental and somatic dysfunction of thoracic region: Secondary | ICD-10-CM | POA: Diagnosis not present

## 2023-06-11 DIAGNOSIS — M9901 Segmental and somatic dysfunction of cervical region: Secondary | ICD-10-CM | POA: Diagnosis not present

## 2023-06-12 DIAGNOSIS — M9902 Segmental and somatic dysfunction of thoracic region: Secondary | ICD-10-CM | POA: Diagnosis not present

## 2023-06-12 DIAGNOSIS — M9903 Segmental and somatic dysfunction of lumbar region: Secondary | ICD-10-CM | POA: Diagnosis not present

## 2023-06-12 DIAGNOSIS — M9905 Segmental and somatic dysfunction of pelvic region: Secondary | ICD-10-CM | POA: Diagnosis not present

## 2023-06-12 DIAGNOSIS — M9901 Segmental and somatic dysfunction of cervical region: Secondary | ICD-10-CM | POA: Diagnosis not present

## 2023-06-18 DIAGNOSIS — M9902 Segmental and somatic dysfunction of thoracic region: Secondary | ICD-10-CM | POA: Diagnosis not present

## 2023-06-18 DIAGNOSIS — M9905 Segmental and somatic dysfunction of pelvic region: Secondary | ICD-10-CM | POA: Diagnosis not present

## 2023-06-18 DIAGNOSIS — M9901 Segmental and somatic dysfunction of cervical region: Secondary | ICD-10-CM | POA: Diagnosis not present

## 2023-06-18 DIAGNOSIS — M9903 Segmental and somatic dysfunction of lumbar region: Secondary | ICD-10-CM | POA: Diagnosis not present

## 2023-06-19 DIAGNOSIS — M9901 Segmental and somatic dysfunction of cervical region: Secondary | ICD-10-CM | POA: Diagnosis not present

## 2023-06-19 DIAGNOSIS — M9902 Segmental and somatic dysfunction of thoracic region: Secondary | ICD-10-CM | POA: Diagnosis not present

## 2023-06-19 DIAGNOSIS — M9905 Segmental and somatic dysfunction of pelvic region: Secondary | ICD-10-CM | POA: Diagnosis not present

## 2023-06-19 DIAGNOSIS — M9903 Segmental and somatic dysfunction of lumbar region: Secondary | ICD-10-CM | POA: Diagnosis not present

## 2023-06-24 ENCOUNTER — Ambulatory Visit (INDEPENDENT_AMBULATORY_CARE_PROVIDER_SITE_OTHER): Payer: Medicare Other

## 2023-06-24 DIAGNOSIS — I350 Nonrheumatic aortic (valve) stenosis: Secondary | ICD-10-CM | POA: Diagnosis not present

## 2023-06-24 LAB — ECHOCARDIOGRAM COMPLETE
AR max vel: 1.08 cm2
AV Area VTI: 1.09 cm2
AV Area mean vel: 1.04 cm2
AV Mean grad: 19 mmHg
AV Peak grad: 33.9 mmHg
Ao pk vel: 2.91 m/s
Area-P 1/2: 2.54 cm2
MV M vel: 3.78 m/s
MV Peak grad: 57.2 mmHg
MV VTI: 2.23 cm2
S' Lateral: 2.91 cm

## 2023-06-25 DIAGNOSIS — M9905 Segmental and somatic dysfunction of pelvic region: Secondary | ICD-10-CM | POA: Diagnosis not present

## 2023-06-25 DIAGNOSIS — M9903 Segmental and somatic dysfunction of lumbar region: Secondary | ICD-10-CM | POA: Diagnosis not present

## 2023-06-25 DIAGNOSIS — M9901 Segmental and somatic dysfunction of cervical region: Secondary | ICD-10-CM | POA: Diagnosis not present

## 2023-06-25 DIAGNOSIS — M9902 Segmental and somatic dysfunction of thoracic region: Secondary | ICD-10-CM | POA: Diagnosis not present

## 2023-06-26 DIAGNOSIS — M9901 Segmental and somatic dysfunction of cervical region: Secondary | ICD-10-CM | POA: Diagnosis not present

## 2023-06-26 DIAGNOSIS — M9905 Segmental and somatic dysfunction of pelvic region: Secondary | ICD-10-CM | POA: Diagnosis not present

## 2023-06-26 DIAGNOSIS — M9903 Segmental and somatic dysfunction of lumbar region: Secondary | ICD-10-CM | POA: Diagnosis not present

## 2023-06-26 DIAGNOSIS — M9902 Segmental and somatic dysfunction of thoracic region: Secondary | ICD-10-CM | POA: Diagnosis not present

## 2023-06-28 ENCOUNTER — Telehealth (HOSPITAL_BASED_OUTPATIENT_CLINIC_OR_DEPARTMENT_OTHER): Payer: Self-pay

## 2023-06-28 NOTE — Telephone Encounter (Addendum)
Results called to patient who verbalizes understanding! Patient agreeable to scan in one year, scheduled for August with Gillian Shields, NP.    ----- Message from Alver Sorrow sent at 06/28/2023  1:26 PM EDT ----- Echocardiogram with normal heart pumping function.  Bicuspid aortic valve with moderate stenosis.  This is stable from previous.  Recommend repeat echocardiogram in 1 year.  Recommend scheduling annual cardiology follow-up sometime 08/2023 - 12/2023

## 2023-07-02 DIAGNOSIS — M9902 Segmental and somatic dysfunction of thoracic region: Secondary | ICD-10-CM | POA: Diagnosis not present

## 2023-07-02 DIAGNOSIS — M9901 Segmental and somatic dysfunction of cervical region: Secondary | ICD-10-CM | POA: Diagnosis not present

## 2023-07-02 DIAGNOSIS — M9905 Segmental and somatic dysfunction of pelvic region: Secondary | ICD-10-CM | POA: Diagnosis not present

## 2023-07-02 DIAGNOSIS — M9903 Segmental and somatic dysfunction of lumbar region: Secondary | ICD-10-CM | POA: Diagnosis not present

## 2023-07-03 DIAGNOSIS — M9901 Segmental and somatic dysfunction of cervical region: Secondary | ICD-10-CM | POA: Diagnosis not present

## 2023-07-03 DIAGNOSIS — M9905 Segmental and somatic dysfunction of pelvic region: Secondary | ICD-10-CM | POA: Diagnosis not present

## 2023-07-03 DIAGNOSIS — M9902 Segmental and somatic dysfunction of thoracic region: Secondary | ICD-10-CM | POA: Diagnosis not present

## 2023-07-03 DIAGNOSIS — M9903 Segmental and somatic dysfunction of lumbar region: Secondary | ICD-10-CM | POA: Diagnosis not present

## 2023-07-09 DIAGNOSIS — M9903 Segmental and somatic dysfunction of lumbar region: Secondary | ICD-10-CM | POA: Diagnosis not present

## 2023-07-09 DIAGNOSIS — M9902 Segmental and somatic dysfunction of thoracic region: Secondary | ICD-10-CM | POA: Diagnosis not present

## 2023-07-09 DIAGNOSIS — M9905 Segmental and somatic dysfunction of pelvic region: Secondary | ICD-10-CM | POA: Diagnosis not present

## 2023-07-09 DIAGNOSIS — M9901 Segmental and somatic dysfunction of cervical region: Secondary | ICD-10-CM | POA: Diagnosis not present

## 2023-07-10 DIAGNOSIS — M9905 Segmental and somatic dysfunction of pelvic region: Secondary | ICD-10-CM | POA: Diagnosis not present

## 2023-07-10 DIAGNOSIS — M9903 Segmental and somatic dysfunction of lumbar region: Secondary | ICD-10-CM | POA: Diagnosis not present

## 2023-07-10 DIAGNOSIS — M9901 Segmental and somatic dysfunction of cervical region: Secondary | ICD-10-CM | POA: Diagnosis not present

## 2023-07-10 DIAGNOSIS — M9902 Segmental and somatic dysfunction of thoracic region: Secondary | ICD-10-CM | POA: Diagnosis not present

## 2023-07-16 DIAGNOSIS — M9903 Segmental and somatic dysfunction of lumbar region: Secondary | ICD-10-CM | POA: Diagnosis not present

## 2023-07-16 DIAGNOSIS — M9901 Segmental and somatic dysfunction of cervical region: Secondary | ICD-10-CM | POA: Diagnosis not present

## 2023-07-16 DIAGNOSIS — M9905 Segmental and somatic dysfunction of pelvic region: Secondary | ICD-10-CM | POA: Diagnosis not present

## 2023-07-16 DIAGNOSIS — M9902 Segmental and somatic dysfunction of thoracic region: Secondary | ICD-10-CM | POA: Diagnosis not present

## 2023-07-17 DIAGNOSIS — M9903 Segmental and somatic dysfunction of lumbar region: Secondary | ICD-10-CM | POA: Diagnosis not present

## 2023-07-17 DIAGNOSIS — M9905 Segmental and somatic dysfunction of pelvic region: Secondary | ICD-10-CM | POA: Diagnosis not present

## 2023-07-17 DIAGNOSIS — M9902 Segmental and somatic dysfunction of thoracic region: Secondary | ICD-10-CM | POA: Diagnosis not present

## 2023-07-17 DIAGNOSIS — M9901 Segmental and somatic dysfunction of cervical region: Secondary | ICD-10-CM | POA: Diagnosis not present

## 2023-07-23 DIAGNOSIS — M9901 Segmental and somatic dysfunction of cervical region: Secondary | ICD-10-CM | POA: Diagnosis not present

## 2023-07-23 DIAGNOSIS — M9903 Segmental and somatic dysfunction of lumbar region: Secondary | ICD-10-CM | POA: Diagnosis not present

## 2023-07-23 DIAGNOSIS — M9905 Segmental and somatic dysfunction of pelvic region: Secondary | ICD-10-CM | POA: Diagnosis not present

## 2023-07-23 DIAGNOSIS — M9902 Segmental and somatic dysfunction of thoracic region: Secondary | ICD-10-CM | POA: Diagnosis not present

## 2023-07-24 DIAGNOSIS — M9901 Segmental and somatic dysfunction of cervical region: Secondary | ICD-10-CM | POA: Diagnosis not present

## 2023-07-24 DIAGNOSIS — M9905 Segmental and somatic dysfunction of pelvic region: Secondary | ICD-10-CM | POA: Diagnosis not present

## 2023-07-24 DIAGNOSIS — M9903 Segmental and somatic dysfunction of lumbar region: Secondary | ICD-10-CM | POA: Diagnosis not present

## 2023-07-24 DIAGNOSIS — M9902 Segmental and somatic dysfunction of thoracic region: Secondary | ICD-10-CM | POA: Diagnosis not present

## 2023-08-06 DIAGNOSIS — M9905 Segmental and somatic dysfunction of pelvic region: Secondary | ICD-10-CM | POA: Diagnosis not present

## 2023-08-06 DIAGNOSIS — M9902 Segmental and somatic dysfunction of thoracic region: Secondary | ICD-10-CM | POA: Diagnosis not present

## 2023-08-06 DIAGNOSIS — M9903 Segmental and somatic dysfunction of lumbar region: Secondary | ICD-10-CM | POA: Diagnosis not present

## 2023-08-06 DIAGNOSIS — M9901 Segmental and somatic dysfunction of cervical region: Secondary | ICD-10-CM | POA: Diagnosis not present

## 2023-08-08 DIAGNOSIS — M9901 Segmental and somatic dysfunction of cervical region: Secondary | ICD-10-CM | POA: Diagnosis not present

## 2023-08-08 DIAGNOSIS — M9902 Segmental and somatic dysfunction of thoracic region: Secondary | ICD-10-CM | POA: Diagnosis not present

## 2023-08-08 DIAGNOSIS — M9905 Segmental and somatic dysfunction of pelvic region: Secondary | ICD-10-CM | POA: Diagnosis not present

## 2023-08-08 DIAGNOSIS — M9903 Segmental and somatic dysfunction of lumbar region: Secondary | ICD-10-CM | POA: Diagnosis not present

## 2023-08-12 ENCOUNTER — Ambulatory Visit (HOSPITAL_BASED_OUTPATIENT_CLINIC_OR_DEPARTMENT_OTHER): Payer: Medicare Other | Admitting: Family

## 2023-08-12 ENCOUNTER — Encounter (HOSPITAL_BASED_OUTPATIENT_CLINIC_OR_DEPARTMENT_OTHER): Payer: Self-pay | Admitting: Family

## 2023-08-12 VITALS — BP 136/88 | HR 50 | Ht 71.0 in | Wt 187.0 lb

## 2023-08-12 DIAGNOSIS — I2510 Atherosclerotic heart disease of native coronary artery without angina pectoris: Secondary | ICD-10-CM

## 2023-08-12 DIAGNOSIS — I700 Atherosclerosis of aorta: Secondary | ICD-10-CM

## 2023-08-12 DIAGNOSIS — I10 Essential (primary) hypertension: Secondary | ICD-10-CM | POA: Diagnosis not present

## 2023-08-12 DIAGNOSIS — I350 Nonrheumatic aortic (valve) stenosis: Secondary | ICD-10-CM

## 2023-08-12 DIAGNOSIS — E785 Hyperlipidemia, unspecified: Secondary | ICD-10-CM

## 2023-08-12 NOTE — Progress Notes (Signed)
Cardiology Office Note:  .   Date:  08/12/2023  ID:  Norman Briggs, DOB 23-Apr-1956, MRN [id number] PCP: Raliegh Ip, DO   HeartCare Providers Cardiologist:  Chilton Si, MD    History of Present Illness: Marland Kitchen   Norman Briggs is a 67 y.o. male  with a hx of carotid stenosis, hypertension, aortic stenosis, hyperlipidemia.   Echo 06/11/2022 LVEF 60 to 65%, grade 2 diastolic dysfunction, RV normal size and function, normal PASP, LA severely dilated, moderate calcification of the aortic valve with moderate aortic valve stenosis (mean gradient 19.0 mmHg).   Seen by Dr. Duke Salvia 07/24/2022.  Recommended for repeat echo 05/2023.    Last seen 08/27/2022.  Home BP cuff 120 points higher than manual reading.  Given hypotension at home amlodipine-valsartan reduced to half tablet daily.  Carotid duplex 08/2022 bilateral carotid arteries near normal with only minimal wall thickening or plaque.  Echo 06/18/2023 normal LVEF, bicuspid aortic valve with moderate stenosis stable from previous.    Presents today for follow up independently. Purchased part of a condo in [place]. Enjoys attending live music events. Continues to walk 3 miles nearly 5 days per week. Has cut on carbohydrates like rice, pasta, potatoes. Home BP cuff previously found to be 20 points higher than manual reading. His left knee has been more bothersome and is presently following with orthopedics and chiropractor.   Reports no shortness of breath nor dyspnea on exertion. Reports no chest pain, pressure, or tightness. No edema, orthopnea, PND. Reports no palpitations.    ROS: Please see the history of present illness.    All other systems reviewed and are negative.   Studies Reviewed: Marland Kitchen   EKG Interpretation Date/Time:  Monday August 12 2023 10:01:51 EDT Ventricular Rate:  50 PR Interval:  160 QRS Duration:  92 QT Interval:  438 QTC Calculation: 399 R Axis:   -28  Text Interpretation: Sinus bradycardia Confirmed by  Gillian Shields ([id number]) on 08/12/2023 10:18:57 AM    Cardiac Studies & Procedures       ECHOCARDIOGRAM  ECHOCARDIOGRAM COMPLETE 06/24/2023  Narrative ECHOCARDIOGRAM REPORT    Patient Name:   Norman Briggs Date of Exam: 06/24/2023 Medical Rec #:  [id number]   Height:       71.0 in Accession #:    [id number]  Weight:       181.0 lb Date of Birth:  06/01/1956  BSA:          2.021 m Patient Age:    66 years    BP:           120/75 mmHg Patient Gender: M           HR:           56 bpm. Exam Location:  Outpatient  Procedure: 2D Echo, 3D Echo, Cardiac Doppler, Color Doppler and Strain Analysis  Indications:    Aortic Stenosis  History:        Patient has prior history of Echocardiogram examinations, most recent 06/11/2022. Risk Factors:Hypertension, Former Smoker and Dyslipidemia.  Sonographer:    Jeryl Columbia RDCS Referring Phys: [id number] TIFFANY [place]  IMPRESSIONS   1. Left ventricular ejection fraction, by estimation, is 60 to 65%. Left ventricular ejection fraction by 3D volume is 63 %. The left ventricle has normal function. The left ventricle has no regional wall motion abnormalities. There is mild concentric left ventricular hypertrophy. Left ventricular diastolic parameters are indeterminate. The average left ventricular global longitudinal strain is -18.8 %. The global  longitudinal strain is normal. 2. Right ventricular systolic function is normal. The right ventricular size is normal. Tricuspid regurgitation signal is inadequate for assessing PA pressure. 3. Left atrial size was moderately dilated. 4. The mitral valve is normal in structure. Trivial mitral valve regurgitation. 5. Sievers type 1 bicuspid valve with fused right and left cusps. The aortic valve is bicuspid. There is moderate calcification of the aortic valve. There is moderate thickening of the aortic valve. Aortic valve regurgitation is trivial. Moderate aortic valve stenosis. Aortic valve mean gradient  measures 19.0 mmHg. Aortic valve Vmax measures 2.91 m/s. 6. The inferior vena cava is normal in size with greater than 50% respiratory variability, suggesting right atrial pressure of 3 mmHg.  Comparison(s): No significant change from prior study. Prior images reviewed side by side.  FINDINGS Left Ventricle: Left ventricular ejection fraction, by estimation, is 60 to 65%. Left ventricular ejection fraction by 3D volume is 63 %. The left ventricle has normal function. The left ventricle has no regional wall motion abnormalities. The average left ventricular global longitudinal strain is -18.8 %. The global longitudinal strain is normal. The left ventricular internal cavity size was normal in size. There is mild concentric left ventricular hypertrophy. Left ventricular diastolic parameters are indeterminate. Normal left ventricular filling pressure.  Right Ventricle: The right ventricular size is normal. No increase in right ventricular wall thickness. Right ventricular systolic function is normal. Tricuspid regurgitation signal is inadequate for assessing PA pressure.  Left Atrium: Left atrial size was moderately dilated.  Right Atrium: Right atrial size was normal in size.  Pericardium: There is no evidence of pericardial effusion.  Mitral Valve: The mitral valve is normal in structure. Trivial mitral valve regurgitation. MV peak gradient, 2.5 mmHg. The mean mitral valve gradient is 1.0 mmHg.  Tricuspid Valve: The tricuspid valve is normal in structure. Tricuspid valve regurgitation is not demonstrated.  Aortic Valve: Sievers type 1 bicuspid valve with fused right and left cusps. The aortic valve is bicuspid. There is moderate calcification of the aortic valve. There is moderate thickening of the aortic valve. Aortic valve regurgitation is trivial. Moderate aortic stenosis is present. Aortic valve mean gradient measures 19.0 mmHg. Aortic valve peak gradient measures 33.9 mmHg. Aortic valve  area, by VTI measures 1.09 cm.  Pulmonic Valve: The pulmonic valve was not well visualized. Pulmonic valve regurgitation is not visualized. No evidence of pulmonic stenosis.  Aorta: The aortic root and ascending aorta are structurally normal, with no evidence of dilitation.  Venous: The inferior vena cava is normal in size with greater than 50% respiratory variability, suggesting right atrial pressure of 3 mmHg.  IAS/Shunts: No atrial level shunt detected by color flow Doppler.   LEFT VENTRICLE PLAX 2D LVIDd:         4.70 cm         Diastology LVIDs:         2.91 cm         LV e' medial:    7.62 cm/s LV PW:         1.18 cm         LV E/e' medial:  9.5 LV IVS:        1.21 cm         LV e' lateral:   7.94 cm/s LVOT diam:     2.00 cm         LV E/e' lateral: 9.1 LV SV:         76 LV SV Index:  37              2D LVOT Area:     3.14 cm        Longitudinal Strain 2D Strain GLS  -18.8 % Avg:  3D Volume EF LV 3D EF:    Left ventricul ar ejection fraction by 3D volume is 63 %.  3D Volume EF: 3D EF:        63 % LV EDV:       135 ml LV ESV:       49 ml LV SV:        86 ml  RIGHT VENTRICLE RV Basal diam:  3.73 cm RV Mid diam:    3.16 cm  LEFT ATRIUM              Index        RIGHT ATRIUM           Index LA diam:        4.70 cm  2.33 cm/m   RA Area:     14.90 cm LA Vol (A2C):   81.2 ml  40.18 ml/m  RA Volume:   33.50 ml  16.58 ml/m LA Vol (A4C):   110.0 ml 54.43 ml/m LA Biplane Vol: 100.0 ml 49.48 ml/m AORTIC VALVE AV Area (Vmax):    1.08 cm AV Area (Vmean):   1.04 cm AV Area (VTI):     1.09 cm AV Vmax:           291.00 cm/s AV Vmean:          198.000 cm/s AV VTI:            0.694 m AV Peak Grad:      33.9 mmHg AV Mean Grad:      19.0 mmHg LVOT Vmax:         99.90 cm/s LVOT Vmean:        65.400 cm/s LVOT VTI:          0.241 m LVOT/AV VTI ratio: 0.35  AORTA Ao Root diam: 3.50 cm Ao Asc diam:  3.70 cm  MITRAL VALVE MV Area (PHT): 2.54 cm     SHUNTS MV Area VTI:   2.23 cm    Systemic VTI:  0.24 m MV Peak grad:  2.5 mmHg    Systemic Diam: 2.00 cm MV Mean grad:  1.0 mmHg MV Vmax:       0.79 m/s MV Vmean:      44.0 cm/s MV Decel Time: 299 msec MR Peak grad: 57.2 mmHg MR Vmax:      378.00 cm/s MV E velocity: 72.40 cm/s MV A velocity: 70.80 cm/s MV E/A ratio:  1.02  Mihai Croitoru MD Electronically signed by Thurmon Fair MD Signature Date/Time: 06/24/2023/3:39:03 PM    Final     CT SCANS  CT CARDIAC SCORING (SELF PAY ONLY) 09/10/2017  Addendum 09/11/2017  2:09 PM ADDENDUM REPORT: 09/11/2017 14:07  CLINICAL DATA:  Risk stratification  EXAM: Coronary Calcium Score  TECHNIQUE: The patient was scanned on a Siemens Somatom 64 slice scanner. Axial non-contrast 3 mm slices were carried out through the heart. The data set was analyzed on a dedicated work station and scored using the Agatson method.  FINDINGS: Non-cardiac: See separate report from [facility].  Ascending Aorta:  Pericardium: Normal  Coronary arteries: Mild calcium in proximal circumflex and RCA more dense calcium in proximal and mid LAD  IMPRESSION: Coronary calcium score of 220. This was 78 th percentile for  age and sex matched control.  Mild aortic root dilatation 3.8 cm  Calcification of aortic valve suggest f/u echo for AS  See radiology notes regarding pulmonary findings  Charlton Haws   Electronically Signed By: Charlton Haws M.D. On: 09/11/2017 14:07  Narrative EXAM: OVER-READ INTERPRETATION  CT CHEST  The following report is an over-read performed by radiologist Dr. Hulan Saas of [facility] on 09/10/2017. This over-read does not include interpretation of cardiac or coronary anatomy or pathology. The coronary calcium score interpretation by the cardiologist is attached.  COMPARISON:  None.  FINDINGS: Cardiovascular: Normal heart size. Dense aortic valvular calcification. Mild descending  thoracic aortic atherosclerosis without evidence of aneurysm. No pericardial effusion.  Mediastinum/Nodes: Numerous small calcified mediastinal and bilateral hilar lymph nodes, including lower paraesophageal and paracardiac nodes. No pathologic lymphadenopathy. Visualized esophagus normal in appearance.  Lungs/Pleura: Patchy opacity in the left upper lobe which is incompletely evaluated. Emphysematous changes in both lungs. Calcified granulomas in the anterior right upper lobe (series 4, image 13) and in the lingula (image 31). Linear scar or atelectasis in the superior segment right lower lobe. No confluent airspace consolidation. No pleural effusions. No evidence of interstitial lung disease.  Upper Abdomen: Visualized extreme upper abdomen unremarkable for the unenhanced technique.  Musculoskeletal: Degenerative changes and DISH involving the visualized lower thoracic spine.  IMPRESSION: 1. Dense aortic valvular calcification. Does the patient have evidence of aortic stenosis? 2. Incompletely imaged patchy opacity in the left upper lobe. Unenhanced CT chest is recommended to completely evaluate the opacity. 3. Old granulomatous disease, with calcified granulomas in the right upper lobe and lingula and numerous calcified mediastinal and bilateral hilar lymph nodes. 4. Mild descending thoracic aortic Atherosclerosis (ICD10-170.0)  Electronically Signed: By: Hulan Saas M.D. On: 09/10/2017 14:59          Risk Assessment/Calculations:             Physical Exam:   VS:  BP 136/88   Pulse (!) 50   Ht 5\' 11"  (1.803 m)   Wt 187 lb (84.8 kg)   BMI 26.08 kg/m    Wt Readings from Last 3 Encounters:  08/12/23 187 lb (84.8 kg)  05/31/23 181 lb (82.1 kg)  08/27/22 197 lb 14.4 oz (89.8 kg)    GEN: Well nourished, well developed in no acute distress NECK: No JVD; No carotid bruits CARDIAC: RRR, no murmurs, rubs, gallops RESPIRATORY:  Clear to auscultation without  rales, wheezing or rhonchi  ABDOMEN: Soft, non-tender, non-distended EXTREMITIES:  No edema; No deformity   ASSESSMENT AND PLAN: .    Moderate aortic stenosis by echo 05/2023 - Overall stable from previous. Repeat echo 05/2024 already ordered. No near syncope, dyspnea, chest pain. Continue optimal BP control.   Coronary atherosclerosis on CT / Aortic atherosclerosis / Hyperlipidemia - Stable with no anginal symptoms. No indication for ischemic evaluation.  05/2023 total cholesterol 147, triglycerides 62, HDL 81, LDL 53.  Continue Rosuvastatin 5mg  daily. Recommend aiming for 150 minutes of moderate intensity activity per week and following a heart healthy diet.    HTN - Initial BP 146/78 with repeat 136/88 without intervention. Previous hypotensive episodes when on full tablet of Amlodipine-Valsartan. Continue present half tablet Amlodipine-Valsartan 5-320mg , hydrochlorothiazide 12.5mg  daily. Discussed to monitor BP at home at least 2 hours after medications and sitting for 5-10 minutes. He will contact us if BP persistently >130/80 at home.  Carotid stenosis - mild carotid stenosis in 2018.  Carotid duplex 08/2022 with no significant  stenosis bilaterally.  No indication for repeat testing.       Dispo: follow up in 1 year  Signed, Alver Sorrow, NP

## 2023-08-12 NOTE — Patient Instructions (Addendum)
Medication Instructions:  Continue your current medications.   *If you need a refill on your cardiac medications before your next appointment, please call your pharmacy*  Testing/Procedures: Your EKG today shows sinus bradycardia which is a normal but slightly slow heart rate. This is a good result!  Follow-Up: At [facility], you and your health needs are our priority.  As part of our continuing mission to provide you with exceptional heart care, we have created designated Provider Care Teams.  These Care Teams include your primary Cardiologist (physician) and Advanced Practice Providers (APPs -  Physician Assistants and Nurse Practitioners) who all work together to provide you with the care you need, when you need it.  We recommend signing up for the patient portal called "MyChart".  Sign up information is provided on this After Visit Summary.  MyChart is used to connect with patients for Virtual Visits (Telemedicine).  Patients are able to view lab/test results, encounter notes, upcoming appointments, etc.  Non-urgent messages can be sent to your provider as well.   To learn more about what you can do with MyChart, go to [email or web address].    Your next appointment:   1 year(s)  Provider:   Chilton Si, MD or Gillian Shields, NP    Other Instructions  Heart Healthy Diet Recommendations: A low-salt diet is recommended. Meats should be grilled, baked, or boiled. Avoid fried foods. Focus on lean protein sources like fish or chicken with vegetables and fruits. The American Heart Association is a Chief Technology Officer!  American Heart Association Diet and Lifeystyle Recommendations   Exercise recommendations: The American Heart Association recommends 150 minutes of moderate intensity exercise weekly. Try 30 minutes of moderate intensity exercise 4-5 times per week. This could include walking, jogging, or swimming.

## 2023-08-13 DIAGNOSIS — M9901 Segmental and somatic dysfunction of cervical region: Secondary | ICD-10-CM | POA: Diagnosis not present

## 2023-08-13 DIAGNOSIS — M9902 Segmental and somatic dysfunction of thoracic region: Secondary | ICD-10-CM | POA: Diagnosis not present

## 2023-08-13 DIAGNOSIS — M9903 Segmental and somatic dysfunction of lumbar region: Secondary | ICD-10-CM | POA: Diagnosis not present

## 2023-08-13 DIAGNOSIS — M9905 Segmental and somatic dysfunction of pelvic region: Secondary | ICD-10-CM | POA: Diagnosis not present

## 2023-08-14 DIAGNOSIS — M9902 Segmental and somatic dysfunction of thoracic region: Secondary | ICD-10-CM | POA: Diagnosis not present

## 2023-08-14 DIAGNOSIS — M9901 Segmental and somatic dysfunction of cervical region: Secondary | ICD-10-CM | POA: Diagnosis not present

## 2023-08-14 DIAGNOSIS — M9905 Segmental and somatic dysfunction of pelvic region: Secondary | ICD-10-CM | POA: Diagnosis not present

## 2023-08-14 DIAGNOSIS — M9903 Segmental and somatic dysfunction of lumbar region: Secondary | ICD-10-CM | POA: Diagnosis not present

## 2023-08-21 ENCOUNTER — Other Ambulatory Visit: Payer: Self-pay | Admitting: Family Medicine

## 2023-08-21 DIAGNOSIS — M9902 Segmental and somatic dysfunction of thoracic region: Secondary | ICD-10-CM | POA: Diagnosis not present

## 2023-08-21 DIAGNOSIS — M9903 Segmental and somatic dysfunction of lumbar region: Secondary | ICD-10-CM | POA: Diagnosis not present

## 2023-08-21 DIAGNOSIS — M9901 Segmental and somatic dysfunction of cervical region: Secondary | ICD-10-CM | POA: Diagnosis not present

## 2023-08-21 DIAGNOSIS — I10 Essential (primary) hypertension: Secondary | ICD-10-CM

## 2023-08-21 DIAGNOSIS — M9905 Segmental and somatic dysfunction of pelvic region: Secondary | ICD-10-CM | POA: Diagnosis not present

## 2023-08-22 DIAGNOSIS — M9905 Segmental and somatic dysfunction of pelvic region: Secondary | ICD-10-CM | POA: Diagnosis not present

## 2023-08-22 DIAGNOSIS — M9902 Segmental and somatic dysfunction of thoracic region: Secondary | ICD-10-CM | POA: Diagnosis not present

## 2023-08-22 DIAGNOSIS — M9903 Segmental and somatic dysfunction of lumbar region: Secondary | ICD-10-CM | POA: Diagnosis not present

## 2023-08-22 DIAGNOSIS — M9901 Segmental and somatic dysfunction of cervical region: Secondary | ICD-10-CM | POA: Diagnosis not present

## 2023-08-27 DIAGNOSIS — M9905 Segmental and somatic dysfunction of pelvic region: Secondary | ICD-10-CM | POA: Diagnosis not present

## 2023-08-27 DIAGNOSIS — M9902 Segmental and somatic dysfunction of thoracic region: Secondary | ICD-10-CM | POA: Diagnosis not present

## 2023-08-27 DIAGNOSIS — M9903 Segmental and somatic dysfunction of lumbar region: Secondary | ICD-10-CM | POA: Diagnosis not present

## 2023-08-27 DIAGNOSIS — M9901 Segmental and somatic dysfunction of cervical region: Secondary | ICD-10-CM | POA: Diagnosis not present

## 2023-08-28 DIAGNOSIS — M9901 Segmental and somatic dysfunction of cervical region: Secondary | ICD-10-CM | POA: Diagnosis not present

## 2023-08-28 DIAGNOSIS — M9905 Segmental and somatic dysfunction of pelvic region: Secondary | ICD-10-CM | POA: Diagnosis not present

## 2023-08-28 DIAGNOSIS — M9903 Segmental and somatic dysfunction of lumbar region: Secondary | ICD-10-CM | POA: Diagnosis not present

## 2023-08-28 DIAGNOSIS — M9902 Segmental and somatic dysfunction of thoracic region: Secondary | ICD-10-CM | POA: Diagnosis not present

## 2023-09-10 DIAGNOSIS — M9905 Segmental and somatic dysfunction of pelvic region: Secondary | ICD-10-CM | POA: Diagnosis not present

## 2023-09-10 DIAGNOSIS — M9901 Segmental and somatic dysfunction of cervical region: Secondary | ICD-10-CM | POA: Diagnosis not present

## 2023-09-10 DIAGNOSIS — M9903 Segmental and somatic dysfunction of lumbar region: Secondary | ICD-10-CM | POA: Diagnosis not present

## 2023-09-10 DIAGNOSIS — M9902 Segmental and somatic dysfunction of thoracic region: Secondary | ICD-10-CM | POA: Diagnosis not present

## 2023-09-11 DIAGNOSIS — M9902 Segmental and somatic dysfunction of thoracic region: Secondary | ICD-10-CM | POA: Diagnosis not present

## 2023-09-11 DIAGNOSIS — M9901 Segmental and somatic dysfunction of cervical region: Secondary | ICD-10-CM | POA: Diagnosis not present

## 2023-09-11 DIAGNOSIS — M9905 Segmental and somatic dysfunction of pelvic region: Secondary | ICD-10-CM | POA: Diagnosis not present

## 2023-09-11 DIAGNOSIS — M9903 Segmental and somatic dysfunction of lumbar region: Secondary | ICD-10-CM | POA: Diagnosis not present

## 2023-09-17 DIAGNOSIS — M9901 Segmental and somatic dysfunction of cervical region: Secondary | ICD-10-CM | POA: Diagnosis not present

## 2023-09-17 DIAGNOSIS — M9903 Segmental and somatic dysfunction of lumbar region: Secondary | ICD-10-CM | POA: Diagnosis not present

## 2023-09-17 DIAGNOSIS — M9902 Segmental and somatic dysfunction of thoracic region: Secondary | ICD-10-CM | POA: Diagnosis not present

## 2023-09-17 DIAGNOSIS — M9905 Segmental and somatic dysfunction of pelvic region: Secondary | ICD-10-CM | POA: Diagnosis not present

## 2023-09-18 DIAGNOSIS — M9903 Segmental and somatic dysfunction of lumbar region: Secondary | ICD-10-CM | POA: Diagnosis not present

## 2023-09-18 DIAGNOSIS — M9902 Segmental and somatic dysfunction of thoracic region: Secondary | ICD-10-CM | POA: Diagnosis not present

## 2023-09-18 DIAGNOSIS — M9901 Segmental and somatic dysfunction of cervical region: Secondary | ICD-10-CM | POA: Diagnosis not present

## 2023-09-18 DIAGNOSIS — M9905 Segmental and somatic dysfunction of pelvic region: Secondary | ICD-10-CM | POA: Diagnosis not present

## 2023-09-24 DIAGNOSIS — M9905 Segmental and somatic dysfunction of pelvic region: Secondary | ICD-10-CM | POA: Diagnosis not present

## 2023-09-24 DIAGNOSIS — M9902 Segmental and somatic dysfunction of thoracic region: Secondary | ICD-10-CM | POA: Diagnosis not present

## 2023-09-24 DIAGNOSIS — M9903 Segmental and somatic dysfunction of lumbar region: Secondary | ICD-10-CM | POA: Diagnosis not present

## 2023-09-24 DIAGNOSIS — M9901 Segmental and somatic dysfunction of cervical region: Secondary | ICD-10-CM | POA: Diagnosis not present

## 2023-09-25 DIAGNOSIS — M9902 Segmental and somatic dysfunction of thoracic region: Secondary | ICD-10-CM | POA: Diagnosis not present

## 2023-09-25 DIAGNOSIS — M9903 Segmental and somatic dysfunction of lumbar region: Secondary | ICD-10-CM | POA: Diagnosis not present

## 2023-09-25 DIAGNOSIS — M9905 Segmental and somatic dysfunction of pelvic region: Secondary | ICD-10-CM | POA: Diagnosis not present

## 2023-09-25 DIAGNOSIS — M9901 Segmental and somatic dysfunction of cervical region: Secondary | ICD-10-CM | POA: Diagnosis not present

## 2023-10-02 DIAGNOSIS — M9902 Segmental and somatic dysfunction of thoracic region: Secondary | ICD-10-CM | POA: Diagnosis not present

## 2023-10-02 DIAGNOSIS — M9905 Segmental and somatic dysfunction of pelvic region: Secondary | ICD-10-CM | POA: Diagnosis not present

## 2023-10-02 DIAGNOSIS — M9903 Segmental and somatic dysfunction of lumbar region: Secondary | ICD-10-CM | POA: Diagnosis not present

## 2023-10-02 DIAGNOSIS — M9901 Segmental and somatic dysfunction of cervical region: Secondary | ICD-10-CM | POA: Diagnosis not present

## 2023-11-18 DIAGNOSIS — L719 Rosacea, unspecified: Secondary | ICD-10-CM | POA: Diagnosis not present

## 2023-11-18 DIAGNOSIS — L821 Other seborrheic keratosis: Secondary | ICD-10-CM | POA: Diagnosis not present

## 2023-11-18 DIAGNOSIS — D229 Melanocytic nevi, unspecified: Secondary | ICD-10-CM | POA: Diagnosis not present

## 2023-11-18 DIAGNOSIS — L814 Other melanin hyperpigmentation: Secondary | ICD-10-CM | POA: Diagnosis not present

## 2023-11-21 DIAGNOSIS — M1712 Unilateral primary osteoarthritis, left knee: Secondary | ICD-10-CM | POA: Diagnosis not present

## 2023-11-21 DIAGNOSIS — S83272A Complex tear of lateral meniscus, current injury, left knee, initial encounter: Secondary | ICD-10-CM | POA: Diagnosis not present

## 2023-11-21 DIAGNOSIS — S83232A Complex tear of medial meniscus, current injury, left knee, initial encounter: Secondary | ICD-10-CM | POA: Diagnosis not present

## 2023-11-21 DIAGNOSIS — M25462 Effusion, left knee: Secondary | ICD-10-CM | POA: Diagnosis not present

## 2024-02-24 ENCOUNTER — Ambulatory Visit (INDEPENDENT_AMBULATORY_CARE_PROVIDER_SITE_OTHER)

## 2024-02-24 DIAGNOSIS — Z23 Encounter for immunization: Secondary | ICD-10-CM

## 2024-02-24 NOTE — Progress Notes (Signed)
 Patient is in office today for a nurse visit for [facility] Immunization. Patient Injection was given in the  Left deltoid. Patient tolerated injection well.

## 2024-03-23 ENCOUNTER — Other Ambulatory Visit: Payer: Self-pay | Admitting: Family Medicine

## 2024-03-23 DIAGNOSIS — N5201 Erectile dysfunction due to arterial insufficiency: Secondary | ICD-10-CM

## 2024-06-01 ENCOUNTER — Encounter: Payer: Medicare Other | Admitting: Family Medicine

## 2024-06-09 ENCOUNTER — Ambulatory Visit: Admitting: Family Medicine

## 2024-06-09 ENCOUNTER — Encounter: Payer: Self-pay | Admitting: Family Medicine

## 2024-06-09 ENCOUNTER — Ambulatory Visit: Payer: Self-pay | Admitting: Family Medicine

## 2024-06-09 VITALS — BP 109/63 | HR 60 | Temp 97.8°F | Ht 71.0 in | Wt 178.8 lb

## 2024-06-09 DIAGNOSIS — R7309 Other abnormal glucose: Secondary | ICD-10-CM | POA: Diagnosis not present

## 2024-06-09 DIAGNOSIS — I700 Atherosclerosis of aorta: Secondary | ICD-10-CM | POA: Diagnosis not present

## 2024-06-09 DIAGNOSIS — J849 Interstitial pulmonary disease, unspecified: Secondary | ICD-10-CM

## 2024-06-09 DIAGNOSIS — Z0001 Encounter for general adult medical examination with abnormal findings: Secondary | ICD-10-CM

## 2024-06-09 DIAGNOSIS — E559 Vitamin D deficiency, unspecified: Secondary | ICD-10-CM | POA: Diagnosis not present

## 2024-06-09 DIAGNOSIS — I10 Essential (primary) hypertension: Secondary | ICD-10-CM | POA: Diagnosis not present

## 2024-06-09 DIAGNOSIS — E8881 Metabolic syndrome: Secondary | ICD-10-CM

## 2024-06-09 DIAGNOSIS — E7800 Pure hypercholesterolemia, unspecified: Secondary | ICD-10-CM

## 2024-06-09 DIAGNOSIS — Z0000 Encounter for general adult medical examination without abnormal findings: Secondary | ICD-10-CM

## 2024-06-09 LAB — BAYER DCA HB A1C WAIVED: HB A1C (BAYER DCA - WAIVED): 5.6 % (ref 4.8–5.6)

## 2024-06-09 MED ORDER — AMLODIPINE BESYLATE-VALSARTAN 5-320 MG PO TABS: 0.5000 | ORAL_TABLET | Freq: Every day | ORAL | 3 refills | Status: AC

## 2024-06-09 MED ORDER — HYDROCHLOROTHIAZIDE 25 MG PO TABS: 12.5000 mg | ORAL_TABLET | Freq: Every day | ORAL | 3 refills | Status: AC

## 2024-06-09 MED ORDER — ROSUVASTATIN CALCIUM 10 MG PO TABS
5.0000 mg | ORAL_TABLET | Freq: Every day | ORAL | 3 refills | Status: AC
Start: 2024-06-09 — End: ?

## 2024-06-09 NOTE — Patient Instructions (Signed)
 Preventive Care 73 Years and Older, Male Preventive care refers to lifestyle choices and visits with your health care provider that can promote health and wellness. Preventive care visits are also called wellness exams. What can I expect for my preventive care visit? Counseling During your preventive care visit, your health care provider may ask about your: Medical history, including: Past medical problems. Family medical history. History of falls. Current health, including: Emotional well-being. Home life and relationship well-being. Sexual activity. Memory and ability to understand (cognition). Lifestyle, including: Alcohol, nicotine or tobacco, and drug use. Access to firearms. Diet, exercise, and sleep habits. Work and work Astronomer. Sunscreen use. Safety issues such as seatbelt and bike helmet use. Physical exam Your health care provider will check your: Height and weight. These may be used to calculate your BMI (body mass index). BMI is a measurement that tells if you are at a healthy weight. Waist circumference. This measures the distance around your waistline. This measurement also tells if you are at a healthy weight and may help predict your risk of certain diseases, such as type 2 diabetes and high blood pressure. Heart rate and blood pressure. Body temperature. Skin for abnormal spots. What immunizations do I need?  Vaccines are usually given at various ages, according to a schedule. Your health care provider will recommend vaccines for you based on your age, medical history, and lifestyle or other factors, such as travel or where you work. What tests do I need? Screening Your health care provider may recommend screening tests for certain conditions. This may include: Lipid and cholesterol levels. Diabetes screening. This is done by checking your blood sugar (glucose) after you have not eaten for a while (fasting). Hepatitis C test. Hepatitis B test. HIV (human  immunodeficiency virus) test. STI (sexually transmitted infection) testing, if you are at risk. Lung cancer screening. Colorectal cancer screening. Prostate cancer screening. Abdominal aortic aneurysm (AAA) screening. You may need this if you are a current or former smoker. Talk with your health care provider about your test results, treatment options, and if necessary, the need for more tests. Follow these instructions at home: Eating and drinking  Eat a diet that includes fresh fruits and vegetables, whole grains, lean protein, and low-fat dairy products. Limit your intake of foods with high amounts of sugar, saturated fats, and salt. Take vitamin and mineral supplements as recommended by your health care provider. Do not drink alcohol if your health care provider tells you not to drink. If you drink alcohol: Limit how much you have to 0-2 drinks a day. Know how much alcohol is in your drink. In the [place] one drink equals one 12 oz bottle of beer (355 mL), one 5 oz glass of wine (148 mL), or one 1 oz glass of hard liquor (44 mL). Lifestyle Brush your teeth every morning and night with fluoride toothpaste. Floss one time each day. Exercise for at least 30 minutes 5 or more days each week. Do not use any products that contain nicotine or tobacco. These products include cigarettes, chewing tobacco, and vaping devices, such as e-cigarettes. If you need help quitting, ask your health care provider. Do not use drugs. If you are sexually active, practice safe sex. Use a condom or other form of protection to prevent STIs. Take aspirin only as told by your health care provider. Make sure that you understand how much to take and what form to take. Work with your health care provider to find out whether it is safe  and beneficial for you to take aspirin daily. Ask your health care provider if you need to take a cholesterol-lowering medicine (statin). Find healthy ways to manage stress, such  as: Meditation, yoga, or listening to music. Journaling. Talking to a trusted person. Spending time with friends and family. Safety Always wear your seat belt while driving or riding in a vehicle. Do not drive: If you have been drinking alcohol. Do not ride with someone who has been drinking. When you are tired or distracted. While texting. If you have been using any mind-altering substances or drugs. Wear a helmet and other protective equipment during sports activities. If you have firearms in your house, make sure you follow all gun safety procedures. Minimize exposure to UV radiation to reduce your risk of skin cancer. What's next? Visit your health care provider once a year for an annual wellness visit. Ask your health care provider how often you should have your eyes and teeth checked. Stay up to date on all vaccines. This information is not intended to replace advice given to you by your health care provider. Make sure you discuss any questions you have with your health care provider. Document Revised: 05/31/2021 Document Reviewed: 05/31/2021 Elsevier Patient Education  2024 [employer].

## 2024-06-09 NOTE — Progress Notes (Signed)
 Nichols Corter is a 68 y.o. male presents to office today for annual physical exam examination.    Concerns today include: Left knee pain but chiropractic medicine and topical OTCs working well right now. Sees someone at [facility] as well in ortho but no plans for surgery at this time.  Occupation: retired, Marital status: in a monogamous relationship, Substance use: none There are no preventive care reminders to display for this patient.  Refills needed today: all  Immunization History  Administered Date(s) Administered   Influenza, High Dose Seasonal PF 09/25/2021   Influenza, Quadrivalent, Recombinant, Inj, Pf 11/05/2019   Influenza,inj,Quad PF,6+ Mos 01/07/2014, 09/19/2015, 10/15/2016, 11/12/2017, 10/21/2018   Influenza,inj,quad, With Preservative 11/25/2014   Moderna SARS-COV2 Booster Vaccination 11/14/2020   Moderna Sars-Covid-2 Vaccination 03/02/2020, 03/30/2020   PNEUMOCOCCAL CONJUGATE-20 05/28/2022   Pneumococcal Conjugate-13 05/26/2021   Td 06/01/2020   Tdap 03/07/2010   Zoster Recombinant(Shingrix ) 05/28/2022, 02/24/2024   Past Medical History:  Diagnosis Date   Allergy    Aortic atherosclerosis (HCC) 04/23/2018   Per CT scan   Arthritis    shoulder-right   Carotid stenosis 07/24/2022   History of elevated PSA 07/02/2013   Hyperlipidemia    Hypertension    Vitamin D  deficiency    Social History   Socioeconomic History   Marital status: Divorced    Spouse name: Not on file   Number of children: Not on file   Years of education: Not on file   Highest education level: Not on file  Occupational History   Not on file  Tobacco Use   Smoking status: Former    Current packs/day: 0.00    Types: Cigarettes    Quit date: 07/02/2010    Years since quitting: 13.9   Smokeless tobacco: Never   Tobacco comments:    social smoker per patient.   Vaping Use   Vaping status: Never Used  Substance and Sexual Activity   Alcohol use: Yes    Alcohol/week: 4.0 standard drinks of  alcohol    Types: 4 Cans of beer per week    Comment: occ   Drug use: No   Sexual activity: Not on file  Other Topics Concern   Not on file  Social History Narrative   Not on file   Social Drivers of Health   Financial Resource Strain: Low Risk  (07/24/2022)   Overall Financial Resource Strain (CARDIA)    Difficulty of Paying Living Expenses: Not hard at all  Food Insecurity: No Food Insecurity (07/24/2022)   Hunger Vital Sign    Worried About Running Out of Food in the Last Year: Never true    Ran Out of Food in the Last Year: Never true  Transportation Needs: No Transportation Needs (07/24/2022)   PRAPARE - Administrator, Civil Service (Medical): No    Lack of Transportation (Non-Medical): No  Physical Activity: Sufficiently Active (07/24/2022)   Exercise Vital Sign    Days of Exercise per Week: 5 days    Minutes of Exercise per Session: 50 min  Stress: Not on file  Social Connections: Not on file  Intimate Partner Violence: Not on file   Past Surgical History:  Procedure Laterality Date   APPENDECTOMY  12/17/1974   COLONOSCOPY  2006 + 02/2016   diverticulosis - [facility]   KNEE SURGERY Right    x 3 surgeries   lefgt wrist  Left    reapair- glass injury   PILONIDAL CYST EXCISION     SHOULDER SURGERY  Right 05/31/2017   Family History  Problem Relation Age of Onset   Hypertension Mother    Heart attack Father    Colon cancer Neg Hx    Colon polyps Neg Hx    Esophageal cancer Neg Hx    Rectal cancer Neg Hx    Stomach cancer Neg Hx     Current Outpatient Medications:    aspirin EC 81 MG tablet, Take 81 mg by mouth daily., Disp: , Rfl:    Azelastine  HCl 137 MCG/SPRAY SOLN, PLACE 1 SPRAY INTO BOTH NOSTRILS AT BEDTIME. USE IN EACH NOSTRIL AS DIRECTED, Disp: 90 mL, Rfl: 3   Cholecalciferol (VITAMIN D -3) 5000 UNITS TABS, Take 1 tablet by mouth daily., Disp: , Rfl:    Multiple Vitamins-Minerals (EYE-VITE PLUS LUTEIN) CAPS, Take 1 capsule by mouth daily. , Disp: ,  Rfl:    Omega-3 Fatty Acids (FISH OIL) 1200 MG CAPS, Take 2 capsules by mouth daily., Disp: , Rfl:    sildenafil  (REVATIO ) 20 MG tablet, TAKE 2 TO 5 TABLETS BY MOUTH ONCE DAILY AS NEEDED FOR  SEXUAL  ACTIVITY, Disp: 50 tablet, Rfl: PRN   amLODipine -valsartan  (EXFORGE ) 5-320 MG tablet, Take 0.5 tablets by mouth daily., Disp: 45 tablet, Rfl: 3   hydrochlorothiazide  (HYDRODIURIL ) 25 MG tablet, Take 0.5 tablets (12.5 mg total) by mouth daily., Disp: 45 tablet, Rfl: 3   rosuvastatin  (CRESTOR ) 10 MG tablet, Take 0.5 tablets (5 mg total) by mouth daily., Disp: 45 tablet, Rfl: 3  Allergies  Allergen Reactions   Lipitor [Atorvastatin ]     Joint pain and myalgias   Anaprox [Naproxen Sodium] Rash   Lisinopril Cough     ROS: Review of Systems A comprehensive review of systems was negative except for: Musculoskeletal: positive for back pain and left knee pain    Physical exam BP 109/63   Pulse 60   Temp 97.8 F (36.6 C)   Ht 5' 11 (1.803 m)   Wt 178 lb 12.8 oz (81.1 kg)   SpO2 98%   BMI 24.94 kg/m  General appearance: alert, cooperative, appears stated age, and no distress Head: Normocephalic, without obvious abnormality, atraumatic Eyes: negative findings: lids and lashes normal, conjunctivae and sclerae normal, corneas clear, and pupils equal, round, reactive to light and accomodation Ears: normal TM's and external ear canals both ears Nose: Nares normal. Septum midline. Mucosa normal. No drainage or sinus tenderness. Throat: lips, mucosa, and tongue normal; teeth and gums normal Neck: no adenopathy, supple, symmetrical, trachea midline, and thyroid  not enlarged, symmetric, no tenderness/mass/nodules Back: symmetric, no curvature. ROM normal. No CVA tenderness. Lungs: clear to auscultation bilaterally Chest wall: no tenderness Heart: regular rate and rhythm, S1, S2 normal, no murmur, click, rub or gallop Abdomen: soft, non-tender; bowel sounds normal; no masses,  no  organomegaly Extremities: extremities normal, atraumatic, no cyanosis or edema and well-healed postop scar noted on the right anterior knee Pulses: 2+ and symmetric Skin: Skin color, texture, turgor normal. No rashes or lesions Lymph nodes: Cervical, supraclavicular, and axillary nodes normal. Neurologic: Grossly normal      06/09/2024    1:51 PM 05/31/2023    8:09 AM 05/28/2022   10:04 AM  Depression screen PHQ 2/9  Decreased Interest 0 0 0  Down, Depressed, Hopeless 0 0 0  PHQ - 2 Score 0 0 0  Altered sleeping 0 0   Tired, decreased energy 0 0   Change in appetite 0 0   Feeling bad or failure about yourself  0 0  Trouble concentrating 0 0   Moving slowly or fidgety/restless 0 0   Suicidal thoughts 0 0   PHQ-9 Score 0 0   Difficult doing work/chores Not difficult at all Not difficult at all       06/09/2024    1:51 PM 05/31/2023    8:09 AM 05/28/2022   10:04 AM 05/26/2021   10:09 AM  GAD 7 : Generalized Anxiety Score  Nervous, Anxious, on Edge 0 0 0 0  Control/stop worrying 0 0 0 0  Worry too much - different things 0 0 0 0  Trouble relaxing 0 0 0 0  Restless 0 0 0 0  Easily annoyed or irritable 0 0 0 0  Afraid - awful might happen 0 0 0 0  Total GAD 7 Score 0 0 0 0  Anxiety Difficulty Not difficult at all Not difficult at all Not difficult at all Not difficult at all     Assessment/ Plan: Arley Gosling here for annual physical exam.   Annual physical exam  Essential hypertension - Plan: CMP14+EGFR, amLODipine -valsartan  (EXFORGE ) 5-320 MG tablet, hydrochlorothiazide  (HYDRODIURIL ) 25 MG tablet  Pure hypercholesterolemia - Plan: CMP14+EGFR, Lipid Panel, TSH, rosuvastatin  (CRESTOR ) 10 MG tablet  Aortic atherosclerosis (HCC) - Plan: CMP14+EGFR, Lipid Panel, rosuvastatin  (CRESTOR ) 10 MG tablet  Metabolic syndrome - Plan: CMP14+EGFR, Bayer DCA Hb A1c Waived  Interstitial pulmonary disease (HCC) - Plan: CMP14+EGFR, CBC  Vitamin D  deficiency - Plan: CMP14+EGFR, VITAMIN D   25 Hydroxy (Vit-D Deficiency, Fractures)  Blood pressure is well-controlled.  Medications have been renewed.  Check nonfasting labs  Continue statin for prevention of progression of aortic atherosclerosis  Asymptomatic from a pulmonary standpoint.  Continues to be very physically active with golf  Check vitamin D  given history of deficiency  Counseled on healthy lifestyle choices, including diet (rich in fruits, vegetables and lean meats and low in salt and simple carbohydrates) and exercise (at least 30 minutes of moderate physical activity daily).  Patient to follow up 1 year for CPE  Neyland Pettengill M. Jolinda, DO

## 2024-06-10 LAB — CBC
Hematocrit: 45.2 % (ref 37.5–51.0)
Hemoglobin: 15.1 g/dL (ref 13.0–17.7)
MCH: 32.6 pg (ref 26.6–33.0)
MCHC: 33.4 g/dL (ref 31.5–35.7)
MCV: 98 fL — ABNORMAL HIGH (ref 79–97)
Platelets: 239 10*3/uL (ref 150–450)
RBC: 4.63 x10E6/uL (ref 4.14–5.80)
RDW: 11.8 % (ref 11.6–15.4)
WBC: 8.3 10*3/uL (ref 3.4–10.8)

## 2024-06-10 LAB — CMP14+EGFR
ALT: 17 IU/L (ref 0–44)
AST: 29 IU/L (ref 0–40)
Albumin: 4.4 g/dL (ref 3.9–4.9)
Alkaline Phosphatase: 94 IU/L (ref 44–121)
BUN/Creatinine Ratio: 16 (ref 10–24)
BUN: 18 mg/dL (ref 8–27)
Bilirubin Total: 0.6 mg/dL (ref 0.0–1.2)
CO2: 21 mmol/L (ref 20–29)
Calcium: 9.6 mg/dL (ref 8.6–10.2)
Chloride: 97 mmol/L (ref 96–106)
Creatinine, Ser: 1.12 mg/dL (ref 0.76–1.27)
Globulin, Total: 2.5 g/dL (ref 1.5–4.5)
Glucose: 103 mg/dL — ABNORMAL HIGH (ref 70–99)
Potassium: 4.3 mmol/L (ref 3.5–5.2)
Sodium: 137 mmol/L (ref 134–144)
Total Protein: 6.9 g/dL (ref 6.0–8.5)
eGFR: 72 mL/min/{1.73_m2} (ref >59)

## 2024-06-10 LAB — LIPID PANEL
Chol/HDL Ratio: 1.7 ratio (ref 0.0–5.0)
Cholesterol, Total: 145 mg/dL (ref 100–199)
HDL: 84 mg/dL (ref >39)
LDL Chol Calc (NIH): 50 mg/dL (ref 0–99)
Triglycerides: 47 mg/dL (ref 0–149)
VLDL Cholesterol Cal: 11 mg/dL (ref 5–40)

## 2024-06-10 LAB — VITAMIN D 25 HYDROXY (VIT D DEFICIENCY, FRACTURES): Vit D, 25-Hydroxy: 58.2 ng/mL (ref 30.0–100.0)

## 2024-06-10 LAB — TSH: TSH: 0.954 u[IU]/mL (ref 0.450–4.500)

## 2024-07-11 ENCOUNTER — Other Ambulatory Visit: Payer: Self-pay | Admitting: Family Medicine

## 2024-07-11 DIAGNOSIS — J301 Allergic rhinitis due to pollen: Secondary | ICD-10-CM

## 2024-10-13 ENCOUNTER — Encounter: Admitting: Family Medicine

## 2025-06-15 ENCOUNTER — Encounter: Admitting: Family Medicine

## 2025-06-16 ENCOUNTER — Encounter: Payer: Self-pay | Admitting: Family Medicine

## 2025-06-16 ENCOUNTER — Encounter: Admitting: Family Medicine
